# Patient Record
Sex: Female | Born: 1985
Health system: Southern US, Community
[De-identification: ages and names within clinical notes are randomized; demographics above are authoritative.]

## PROBLEM LIST (undated history)

## (undated) DIAGNOSIS — E282 Polycystic ovarian syndrome: Secondary | ICD-10-CM

## (undated) DIAGNOSIS — F419 Anxiety disorder, unspecified: Secondary | ICD-10-CM

## (undated) DIAGNOSIS — D249 Benign neoplasm of unspecified breast: Secondary | ICD-10-CM

## (undated) DIAGNOSIS — O24419 Gestational diabetes mellitus in pregnancy, unspecified control: Secondary | ICD-10-CM

## (undated) DIAGNOSIS — E785 Hyperlipidemia, unspecified: Secondary | ICD-10-CM

## (undated) HISTORY — PX: LEFT OOPHORECTOMY: SHX1961

## (undated) HISTORY — DX: Benign neoplasm of unspecified breast: D24.9

## (undated) HISTORY — PX: BREAST BIOPSY: SHX20

## (undated) HISTORY — PX: UNILATERAL SALPINGECTOMY: SHX6160

## (undated) HISTORY — DX: Polycystic ovarian syndrome: E28.2

## (undated) HISTORY — PX: OTHER SURGICAL HISTORY: SHX169

## (undated) HISTORY — PX: WISDOM TOOTH EXTRACTION: SHX21

## (undated) HISTORY — DX: Hyperlipidemia, unspecified: E78.5

## (undated) HISTORY — DX: Anxiety disorder, unspecified: F41.9

---
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
## 2014-06-16 ENCOUNTER — Ambulatory Visit (INDEPENDENT_AMBULATORY_CARE_PROVIDER_SITE_OTHER): Payer: No Typology Code available for payment source | Admitting: Internal Medicine

## 2014-06-16 VITALS — BP 118/78 | HR 108 | Temp 98.0°F | Resp 16 | Ht 67.0 in | Wt 153.4 lb

## 2014-06-16 DIAGNOSIS — M542 Cervicalgia: Secondary | ICD-10-CM

## 2014-06-16 DIAGNOSIS — S39012A Strain of muscle, fascia and tendon of lower back, initial encounter: Secondary | ICD-10-CM

## 2014-06-16 MED ORDER — CYCLOBENZAPRINE HCL 10 MG PO TABS: 10.0000 mg | ORAL_TABLET | Freq: Three times a day (TID) | ORAL | Status: DC | PRN

## 2014-06-16 MED ORDER — MELOXICAM 7.5 MG PO TABS: 7.5000 mg | ORAL_TABLET | Freq: Every day | ORAL | Status: DC

## 2014-06-16 NOTE — Patient Instructions (Signed)
You can return to work on Friday 06/20/14.  Please continue to ice for the rest of the day today then starting tomorrow rotate between heat and ice. Please take the mobic as prescribed, 7.5 mg once daily for pain and inflammation.  Please take the flexeril as prescribed (10 mg up to three times per day as needed). Be sure not to drive while you are on the flexeril.  Please return to clinic if symptoms have not improved in the next few days.
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
## 2014-06-18 ENCOUNTER — Telehealth: Payer: Self-pay

## 2014-06-18 NOTE — Telephone Encounter (Signed)
Check it out--call to trouble shoot and if no red flags try esgic q6h prn HA #20 with f/u 5-6 days If concerns, recheck now
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
## 2014-06-18 NOTE — Telephone Encounter (Signed)
Pt states that she uses the muscle relaxers are working to dull the ache. She does better with the Flexeril than the Mobic- need some type pain medication to help during the day. Pt has not tried any OTC pain medication to aid her healing. She did take Ibuprofen at first and stopped taking this when she got the prescription. Pt was advised to not take any NSAIDS with the mobic. She would like to find out if it would be possible for her to stay out of work until early next week.

## 2014-06-19 ENCOUNTER — Other Ambulatory Visit: Payer: Self-pay | Admitting: Radiology

## 2014-06-19 MED ORDER — BUTALBITAL-APAP-CAFFEINE 50-325-40 MG PO TABS: 1.0000 | ORAL_TABLET | Freq: Four times a day (QID) | ORAL | Status: DC | PRN

## 2014-06-19 NOTE — Telephone Encounter (Signed)
Pt is not having any of the sx's you listed, so I will send in esgic for her and extend her work note until 06/24/14.

## 2014-06-19 NOTE — Telephone Encounter (Signed)
Please call pt. I expected that she would be able to go back to work today. Please call and ask her if she is having any symptoms such as extremity weakness or numbness, extreme pain, loss of bowel or bladder control, or fever. If she has any of these symptoms please advise her to return to the office for evaluation. If not, please send in esgic. One po every six hours as needed for pain. #20. No refills. And ok to extend work note to return to work on Tuesday 06/24/14. If she needs any extension further than that she will need to come in to see [place].

## 2014-07-28 ENCOUNTER — Telehealth: Payer: Self-pay | Admitting: Internal Medicine

## 2014-07-28 NOTE — Telephone Encounter (Signed)
[employer] has faxed a request for billing statements from 06/15/2014-present. Looks like patient was only seen once on 06/16/2014. Please print and forward to disabilities when complete.   Thank you so very much!  -[facility]

## 2014-08-22 DIAGNOSIS — Z0271 Encounter for disability determination: Secondary | ICD-10-CM

## 2016-07-23 DIAGNOSIS — H5213 Myopia, bilateral: Secondary | ICD-10-CM | POA: Diagnosis not present

## 2017-01-31 DIAGNOSIS — E559 Vitamin D deficiency, unspecified: Secondary | ICD-10-CM | POA: Diagnosis not present

## 2017-01-31 DIAGNOSIS — Z1389 Encounter for screening for other disorder: Secondary | ICD-10-CM | POA: Diagnosis not present

## 2017-01-31 DIAGNOSIS — Z711 Person with feared health complaint in whom no diagnosis is made: Secondary | ICD-10-CM | POA: Diagnosis not present

## 2017-01-31 DIAGNOSIS — R87610 Atypical squamous cells of undetermined significance on cytologic smear of cervix (ASC-US): Secondary | ICD-10-CM | POA: Diagnosis not present

## 2017-01-31 DIAGNOSIS — Z01419 Encounter for gynecological examination (general) (routine) without abnormal findings: Secondary | ICD-10-CM | POA: Diagnosis not present

## 2017-01-31 DIAGNOSIS — Z79899 Other long term (current) drug therapy: Secondary | ICD-10-CM | POA: Diagnosis not present

## 2017-01-31 DIAGNOSIS — Z0000 Encounter for general adult medical examination without abnormal findings: Secondary | ICD-10-CM | POA: Diagnosis not present

## 2017-01-31 DIAGNOSIS — R5383 Other fatigue: Secondary | ICD-10-CM | POA: Diagnosis not present

## 2017-01-31 DIAGNOSIS — Z114 Encounter for screening for human immunodeficiency virus [HIV]: Secondary | ICD-10-CM | POA: Diagnosis not present

## 2017-01-31 DIAGNOSIS — R0602 Shortness of breath: Secondary | ICD-10-CM | POA: Diagnosis not present

## 2017-04-05 DIAGNOSIS — N926 Irregular menstruation, unspecified: Secondary | ICD-10-CM | POA: Diagnosis not present

## 2017-04-05 DIAGNOSIS — R87610 Atypical squamous cells of undetermined significance on cytologic smear of cervix (ASC-US): Secondary | ICD-10-CM | POA: Diagnosis not present

## 2017-04-05 MED FILL — MEDROXYPROGESTERONE 10 MG T: 10 | 90 days supply | Qty: 30 | Fill #0

## 2017-07-06 ENCOUNTER — Encounter (HOSPITAL_COMMUNITY): Payer: Self-pay | Admitting: Emergency Medicine

## 2017-07-06 ENCOUNTER — Emergency Department (HOSPITAL_COMMUNITY)
Admission: EM | Admit: 2017-07-06 | Discharge: 2017-07-06 | Disposition: A | Discharge status: Home / Self Care | Payer: 59 | Attending: Emergency Medicine | Admitting: Emergency Medicine

## 2017-07-06 ENCOUNTER — Encounter: Payer: Self-pay | Admitting: Physician Assistant

## 2017-07-06 ENCOUNTER — Ambulatory Visit (INDEPENDENT_AMBULATORY_CARE_PROVIDER_SITE_OTHER): Payer: 59 | Admitting: Physician Assistant

## 2017-07-06 ENCOUNTER — Emergency Department (HOSPITAL_COMMUNITY): Payer: 59

## 2017-07-06 VITALS — BP 118/83 | HR 99 | Temp 97.7°F | Resp 20 | Ht 63.5 in | Wt 164.8 lb

## 2017-07-06 DIAGNOSIS — R1084 Generalized abdominal pain: Secondary | ICD-10-CM

## 2017-07-06 DIAGNOSIS — R109 Unspecified abdominal pain: Secondary | ICD-10-CM | POA: Diagnosis present

## 2017-07-06 DIAGNOSIS — N9489 Other specified conditions associated with female genital organs and menstrual cycle: Secondary | ICD-10-CM

## 2017-07-06 DIAGNOSIS — R112 Nausea with vomiting, unspecified: Secondary | ICD-10-CM | POA: Diagnosis not present

## 2017-07-06 DIAGNOSIS — R1900 Intra-abdominal and pelvic swelling, mass and lump, unspecified site: Secondary | ICD-10-CM

## 2017-07-06 DIAGNOSIS — R1030 Lower abdominal pain, unspecified: Secondary | ICD-10-CM | POA: Diagnosis not present

## 2017-07-06 DIAGNOSIS — Z79899 Other long term (current) drug therapy: Secondary | ICD-10-CM | POA: Diagnosis not present

## 2017-07-06 DIAGNOSIS — R1907 Generalized intra-abdominal and pelvic swelling, mass and lump: Secondary | ICD-10-CM | POA: Diagnosis not present

## 2017-07-06 DIAGNOSIS — R1909 Other intra-abdominal and pelvic swelling, mass and lump: Secondary | ICD-10-CM | POA: Insufficient documentation

## 2017-07-06 DIAGNOSIS — D259 Leiomyoma of uterus, unspecified: Secondary | ICD-10-CM | POA: Diagnosis not present

## 2017-07-06 LAB — CBC
HEMATOCRIT: 40.6 % (ref 36.0–46.0)
HEMOGLOBIN: 13.3 g/dL (ref 12.0–15.0)
MCH: 28.7 pg (ref 26.0–34.0)
MCHC: 32.8 g/dL (ref 30.0–36.0)
MCV: 87.5 fL (ref 78.0–100.0)
Platelets: 277 10*3/uL (ref 150–400)
RBC: 4.64 MIL/uL (ref 3.87–5.11)
RDW: 12.8 % (ref 11.5–15.5)
WBC: 5.3 10*3/uL (ref 4.0–10.5)

## 2017-07-06 LAB — URINALYSIS, ROUTINE W REFLEX MICROSCOPIC
Bilirubin Urine: NEGATIVE
Glucose, UA: NEGATIVE mg/dL
HGB URINE DIPSTICK: NEGATIVE
Ketones, ur: NEGATIVE mg/dL
Leukocytes, UA: NEGATIVE
NITRITE: NEGATIVE
PROTEIN: 30 mg/dL — AB
SPECIFIC GRAVITY, URINE: 1.025 (ref 1.005–1.030)
pH: 6 (ref 5.0–8.0)

## 2017-07-06 LAB — COMPREHENSIVE METABOLIC PANEL
ALBUMIN: 4.3 g/dL (ref 3.5–5.0)
ALK PHOS: 103 U/L (ref 38–126)
ALT: 23 U/L (ref 14–54)
AST: 24 U/L (ref 15–41)
Anion gap: 9 (ref 5–15)
BILIRUBIN TOTAL: 0.6 mg/dL (ref 0.3–1.2)
BUN: 17 mg/dL (ref 6–20)
CALCIUM: 9.5 mg/dL (ref 8.9–10.3)
CO2: 23 mmol/L (ref 22–32)
Chloride: 104 mmol/L (ref 101–111)
Creatinine, Ser: 0.99 mg/dL (ref 0.44–1.00)
GFR calc Af Amer: >60 mL/min (ref >60)
GFR calc non Af Amer: >60 mL/min (ref >60)
GLUCOSE: 111 mg/dL — AB (ref 65–99)
Potassium: 4.3 mmol/L (ref 3.5–5.1)
SODIUM: 136 mmol/L (ref 135–145)
TOTAL PROTEIN: 8.4 g/dL — AB (ref 6.5–8.1)

## 2017-07-06 LAB — I-STAT BETA HCG BLOOD, ED (MC, WL, AP ONLY)

## 2017-07-06 LAB — LIPASE, BLOOD: Lipase: 26 U/L (ref 11–51)

## 2017-07-06 LAB — POC URINE PREG, ED: PREG TEST UR: NEGATIVE

## 2017-07-06 MED ORDER — KETOROLAC TROMETHAMINE 30 MG/ML IJ SOLN
30.0000 mg | Freq: Once | INTRAMUSCULAR | Status: AC
Start: 2017-07-06 — End: 2017-07-06
  Administered 2017-07-06: 30 mg via INTRAMUSCULAR
  Filled 2017-07-06: qty 1

## 2017-07-06 MED ORDER — OXYCODONE-ACETAMINOPHEN 5-325 MG PO TABS
1.0000 | ORAL_TABLET | Freq: Once | ORAL | Status: AC
  Administered 2017-07-06: 1 via ORAL
  Filled 2017-07-06: qty 1

## 2017-07-06 MED ORDER — OXYCODONE-ACETAMINOPHEN 5-325 MG PO TABS: 1.0000 | ORAL_TABLET | ORAL | 0 refills | Status: DC | PRN

## 2017-07-06 NOTE — ED Provider Notes (Signed)
31 year old female presents today with pelvic pain and is noted on ultrasound to have a pelvic mass.  She is not pregnant.  She is hemodynamically stable.  We are awaiting call from her OB/GYN to arrange follow-up.  I discussed this with the patient and she voices understanding.   Pattricia Boss, MD 07/09/17 0001

## 2017-07-06 NOTE — ED Triage Notes (Signed)
Pt reports new lower abd pain today 2 episodes of emesis, denies fevers/chills.  Pt reports being referred from [facility] today, told she needed a transvag ultrasound. LMP mid Sept.

## 2017-07-06 NOTE — Progress Notes (Signed)
Melissa Liu  MRN: [id number] DOB: [birth year]  Subjective:   Melissa Liu is a 31 y.o. female who presents for evaluation of abdominal pain. Onset was 1 day ago. Symptoms have been unchanged. The pain is described as sharp, and is 10/10 in intensity. Pain is located in the periumbilical region and LLQ with radiation to left hip.  Aggravating factors: any position.  Alleviating factors: none. Associated symptoms: diarrhea, nausea, and vomiting (5 episodes since yesterday). The patient denies hematemesis, anorexia, belching, constipation, dysuria, fever, frequency, hematochezia, hematuria, melena, myalgias and sweats. Menstrual cycles are irregular. LMP 05/21/17. She is not on birth control. Took home pregnancy test last nigh and it was normal. She is sexually active. Last time she ate was yesterday, ate some chicken from [employer]. Her husband ate the same thing and is not having any symptoms. She is able to drink water. PSH includes wisdom tooth extraction. No hx of ovarian cysts or uterine fibroids. Drinks alcohol occasionally. Denies smoking.    Review of Systems  Genitourinary: Negative for vaginal discharge and vaginal pain.  Neurological: Negative for dizziness, light-headedness and headaches.    There are no active problems to display for this patient.   No current outpatient prescriptions on file prior to visit.   No current facility-administered medications on file prior to visit.     No Known Allergies   Objective:  BP 118/83 (BP Location: Left Arm, Patient Position: Sitting, Cuff Size: Normal)   Pulse 99   Temp 97.7 F (36.5 C) (Oral)   Resp 20   Ht 5' 3.5" (1.613 m)   Wt 164 lb 12.8 oz (74.8 kg)   LMP 05/23/2017   SpO2 97%   BMI 28.73 kg/m   Physical Exam  Constitutional: She is oriented to person, place, and time.  Appears uncomfortable sitting on exam table holding stomach.   HENT:  Head: Normocephalic and atraumatic.  Eyes: Conjunctivae are normal.    Neck: Normal range of motion.  Pulmonary/Chest: Effort normal.  Abdominal: Soft. Normal appearance and bowel sounds are normal. There is no hepatomegaly. There is generalized tenderness (exquisite tenderess with palpation of all regions of abdomen, more prominent in LLQ and suprapubic region ). There is guarding (voluntary). There is no CVA tenderness.  Genitourinary: Right adnexa normal and vulva normal. Uterus is enlarged (palpated up to umbilical region). Cervix exhibits no motion tenderness. Left adnexum displays tenderness. Left adnexum displays no mass. Thick  creamy and vaginal discharge found.  Neurological: She is alert and oriented to person, place, and time.  Holding on to her husband as she walks down the hall in bent over position.   Skin: Skin is warm and dry.  Psychiatric: Affect normal.  Vitals reviewed.  No results found for this or any previous visit (from the past 24 hour(s)).   Assessment and Plan :  This case was precepted with Dr. Pamella Pert.   1. Generalized abdominal pain 2. Nausea and vomiting, intractability of vomiting not specified, unspecified vomiting type Pain is out of proportion to exam.  She cannot ambulate without assistance from her husband due to pain.  She is actively [place] office. She is exquisitely tender to palpation diffusely on abdomen.  Concern for acute abdomen. Discussed patient with Dr. Pamella Pert and she agrees that pt warrants further evaluation by ED. Her vitals are stable and she was brought here by her husband. She does not warrant EMS transport. Agrees to go to ED via personal vehicle.   Tenna Delaine PA-C  [facility] [place] 07/06/2017 11:09 AM

## 2017-07-06 NOTE — ED Notes (Signed)
ED Provider at bedside. 

## 2017-07-06 NOTE — Patient Instructions (Signed)
  Please Go To [place]. [facility] [address], [place], [place] [address]     IF you received an x-ray today, you will receive an invoice from [facility]. Please contact [facility] at [phone] with questions or concerns regarding your invoice.   IF you received labwork today, you will receive an invoice from [facility]. Please contact [facility] at [phone] with questions or concerns regarding your invoice.   Our billing staff will not be able to assist you with questions regarding bills from these companies.  You will be contacted with the lab results as soon as they are available. The fastest way to get your results is to activate your My Chart account. Instructions are located on the last page of this paperwork. If you have not heard from [place] regarding the results in 2 weeks, please contact this office.

## 2017-07-06 NOTE — Discharge Instructions (Signed)
Call Dr. Harrington Challenger in the morning to be seen within the next week. Use birth control.
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
## 2017-07-06 NOTE — ED Notes (Addendum)
LMP approx Sept 16-23. Pt reports she has not taken [facility] since May. Pt reports she got married and has been trying to conceive.

## 2017-07-06 NOTE — Progress Notes (Deleted)
Subjective:     Melissa Liu is a 31 y.o. female who presents for evaluation of abdominal pain. Onset was {numbers; 1-10:13787} {unit:19031} ago. Symptoms have been {course:17::"unchanged"}. The pain is described as {quality:19151}, and is {0-10:5044}/10 in intensity. Pain is located in the {anatomy; location abdomen:19149} {abdomen pain radiation:616}.  Aggravating factors: {aggrav factors:619}.  Alleviating factors: {allev factors:620}. Associated symptoms: {assoc symptoms:621}. The patient denies {assoc symptoms:19032}.  {Misc; AMB Common SmartLinks:21383::"The patient's history has been marked as reviewed and updated as appropriate."}  Review of Systems {ros - complete:30496}     Objective:    {Exam, Complete:17964}    Assessment:    Abdominal pain, likely secondary to *** .    Plan:    {abd pain treatment plan:14425}

## 2017-07-06 NOTE — ED Notes (Signed)
Patient transported to Ultrasound 

## 2017-07-06 NOTE — ED Provider Notes (Addendum)
Thank operating room [facility] Provider Note   CSN: [id number] Arrival date & time: 07/06/17  1135     History   Chief Complaint Chief Complaint  Patient presents with  . Abdominal Pain    HPI Melissa Liu is a 31 y.o. female.  HPI Patient presents with abdominal pain.  Began yesterday but had another episode today.  Crampy lower abdominal pain.  Seen in urgent care and sent here.  Has lower abdominal mass.  Last menses was in September but has had some change in her hormonal treatments.  Had a negative pregnancy test at home and at [facility].  No dysuria.  Has had nausea and vomiting and some loose stool but not frank diarrhea.  No fevers.  Pelvic exam done and reportedly no cervical motion tenderness and some mild white discharge.  Cultures or samples not appear to have been done.  Sent here for ultrasound and further evaluation. Past Medical History:  Diagnosis Date  . Hyperlipidemia     There are no active problems to display for this patient.   History reviewed. No pertinent surgical history.  OB History    No data available       Home Medications    Prior to Admission medications   Medication Sig Start Date End Date Taking? Authorizing Provider  acetaminophen (TYLENOL) 650 MG CR tablet Take 1,300 mg by mouth every 8 (eight) hours as needed for pain.   Yes [provider]  medroxyPROGESTERone (PROVERA) 10 MG tablet Take 10 mg by mouth daily. 04/05/17  Yes [provider]  Prenatal Vit-Fe Fumarate-FA (MULTIVITAMIN-PRENATAL) 27-0.8 MG TABS tablet Take 2 tablets by mouth daily at 12 noon.   Yes [provider]  PRESCRIPTION MEDICATION Take 1 tablet by mouth once. Muscle relaxant that her husband gave her   Yes [provider]    Family History Family History  Problem Relation Age of Onset  . Hyperlipidemia Mother   . Hyperlipidemia Father   . Hyperlipidemia Maternal Grandmother   .  Hypertension Maternal Grandmother   . Cancer Maternal Grandfather   . Diabetes Maternal Grandfather     Social History Social History  Substance Use Topics  . Smoking status: Never Smoker  . Smokeless tobacco: Never Used  . Alcohol use Yes     Comment: occ     Allergies   Coconut oil   Review of Systems Review of Systems  Constitutional: Negative for appetite change, chills and fever.  Cardiovascular: Negative for chest pain.  Gastrointestinal: Positive for abdominal pain, nausea and vomiting.  Genitourinary: Negative for frequency, vaginal bleeding and vaginal discharge.  Musculoskeletal: Negative for back pain.  Skin: Negative for rash.  Neurological: Negative for syncope.  Hematological: Negative for adenopathy.  Psychiatric/Behavioral: Negative for confusion.     Physical Exam Updated Vital Signs BP 125/89 (BP Location: Right Arm)   Pulse 99   Temp 98.2 F (36.8 C) (Oral)   Resp 18   LMP 05/22/2017 (Exact Date)   SpO2 100%   Physical Exam  Constitutional: She appears well-developed.  HENT:  Head: Atraumatic.  Eyes: Pupils are equal, round, and reactive to light.  Neck: Neck supple.  Cardiovascular: Normal rate.   Pulmonary/Chest: No respiratory distress.  Abdominal: She exhibits mass.  Suprapubic to right lower quadrant mass that goes up to around the umbilicus.  Mild tenderness.  No rebound or guarding.  No hernias palpated.  Musculoskeletal: She exhibits no edema.  Neurological: She is alert.  Skin: Skin is warm. Capillary refill takes less than 2 seconds.  Psychiatric: She has a normal mood and affect.     ED Treatments / Results  Labs (all labs ordered are listed, but only abnormal results are displayed) Labs Reviewed  COMPREHENSIVE METABOLIC PANEL - Abnormal; Notable for the following:       Result Value   Glucose, Bld 111 (*)    Total Protein 8.4 (*)    All other components within normal limits  URINALYSIS, ROUTINE W REFLEX MICROSCOPIC -  Abnormal; Notable for the following:    APPearance HAZY (*)    Protein, ur 30 (*)    Bacteria, UA RARE (*)    Squamous Epithelial / LPF 0-5 (*)    All other components within normal limits  LIPASE, BLOOD  CBC  POC URINE PREG, ED  I-STAT BETA HCG BLOOD, ED ([facility], [facility], [facility] ONLY)    EKG  EKG Interpretation None       Radiology No results found.  Procedures Procedures (including critical care time)  Medications Ordered in ED Medications - No data to display   Initial Impression / Assessment and Plan / ED Course  I have reviewed the triage vital signs and the nursing notes.  Pertinent labs & imaging results that were available during my care of the patient were reviewed by me and considered in my medical decision making (see chart for details).     Patient with abdominal pain.  Has pelvic mass.  Likely ovarian cyst but ultrasound pending.  Doubt infectious.  Wet prep or other cultures not done at urgent care but I think it is okay to defer at this time.  Has GYN for follow-up.  Care will be turned over to Dr. Jeanell Sparrow Final Clinical Impressions(s) / ED Diagnoses   Final diagnoses:  Pelvic pain    New Prescriptions New Prescriptions   No medications on file     Melissa Belling, MD 07/06/17 [place]    Melissa Belling, MD 07/06/17 1524

## 2017-07-13 DIAGNOSIS — D259 Leiomyoma of uterus, unspecified: Secondary | ICD-10-CM | POA: Diagnosis not present

## 2017-07-13 DIAGNOSIS — R1909 Other intra-abdominal and pelvic swelling, mass and lump: Secondary | ICD-10-CM | POA: Diagnosis not present

## 2017-07-22 DIAGNOSIS — H5213 Myopia, bilateral: Secondary | ICD-10-CM | POA: Diagnosis not present

## 2018-02-06 DIAGNOSIS — R87610 Atypical squamous cells of undetermined significance on cytologic smear of cervix (ASC-US): Secondary | ICD-10-CM | POA: Diagnosis not present

## 2018-02-06 DIAGNOSIS — Z124 Encounter for screening for malignant neoplasm of cervix: Secondary | ICD-10-CM | POA: Diagnosis not present

## 2018-02-06 DIAGNOSIS — Z01419 Encounter for gynecological examination (general) (routine) without abnormal findings: Secondary | ICD-10-CM | POA: Diagnosis not present

## 2018-02-12 DIAGNOSIS — R319 Hematuria, unspecified: Secondary | ICD-10-CM | POA: Diagnosis not present

## 2018-02-12 DIAGNOSIS — Z13220 Encounter for screening for lipoid disorders: Secondary | ICD-10-CM | POA: Diagnosis not present

## 2018-02-12 DIAGNOSIS — Z0000 Encounter for general adult medical examination without abnormal findings: Secondary | ICD-10-CM | POA: Diagnosis not present

## 2018-02-12 DIAGNOSIS — E559 Vitamin D deficiency, unspecified: Secondary | ICD-10-CM | POA: Diagnosis not present

## 2018-02-12 DIAGNOSIS — Z79899 Other long term (current) drug therapy: Secondary | ICD-10-CM | POA: Diagnosis not present

## 2018-02-12 DIAGNOSIS — R0602 Shortness of breath: Secondary | ICD-10-CM | POA: Diagnosis not present

## 2018-02-12 DIAGNOSIS — Z114 Encounter for screening for human immunodeficiency virus [HIV]: Secondary | ICD-10-CM | POA: Diagnosis not present

## 2018-02-12 DIAGNOSIS — E538 Deficiency of other specified B group vitamins: Secondary | ICD-10-CM | POA: Diagnosis not present

## 2018-02-12 DIAGNOSIS — R5383 Other fatigue: Secondary | ICD-10-CM | POA: Diagnosis not present

## 2018-02-12 DIAGNOSIS — Z131 Encounter for screening for diabetes mellitus: Secondary | ICD-10-CM | POA: Diagnosis not present

## 2018-02-12 MED FILL — SULFAMETHOXAZOLE-TMP DS TAB: 800-160 | 7 days supply | Qty: 14 | Fill #0

## 2018-02-12 MED FILL — HYDROXYZINE PAM 25 MG CAP: 25 | 5 days supply | Qty: 30 | Fill #0

## 2018-02-19 DIAGNOSIS — R319 Hematuria, unspecified: Secondary | ICD-10-CM | POA: Diagnosis not present

## 2018-03-14 DIAGNOSIS — R319 Hematuria, unspecified: Secondary | ICD-10-CM | POA: Diagnosis not present

## 2018-03-14 DIAGNOSIS — Z6832 Body mass index (BMI) 32.0-32.9, adult: Secondary | ICD-10-CM | POA: Diagnosis not present

## 2018-03-14 DIAGNOSIS — R635 Abnormal weight gain: Secondary | ICD-10-CM | POA: Diagnosis not present

## 2018-03-14 MED FILL — IBUPROFEN 800 MG TAB: 800 | 30 days supply | Qty: 90 | Fill #0

## 2018-03-14 MED FILL — DIETHYLPROPION ER 75 MG TAB: 75 | 30 days supply | Qty: 30 | Fill #0

## 2018-04-09 DIAGNOSIS — Z3201 Encounter for pregnancy test, result positive: Secondary | ICD-10-CM | POA: Diagnosis not present

## 2018-04-24 DIAGNOSIS — O200 Threatened abortion: Secondary | ICD-10-CM | POA: Diagnosis not present

## 2018-04-24 DIAGNOSIS — O26849 Uterine size-date discrepancy, unspecified trimester: Secondary | ICD-10-CM | POA: Diagnosis not present

## 2018-04-24 MED FILL — PROGESTERONE MICRONIZED 200: 200 | 30 days supply | Qty: 30 | Fill #0

## 2018-05-01 DIAGNOSIS — O3680X9 Pregnancy with inconclusive fetal viability, other fetus: Secondary | ICD-10-CM | POA: Diagnosis not present

## 2018-05-03 MED FILL — miSOPROStol 200 MCG TABS: 200 | 1 days supply | Qty: 4 | Fill #0

## 2018-05-10 ENCOUNTER — Other Ambulatory Visit: Payer: Self-pay | Admitting: Obstetrics and Gynecology

## 2018-05-10 ENCOUNTER — Ambulatory Visit (HOSPITAL_COMMUNITY): Payer: 59 | Admitting: Certified Registered Nurse Anesthetist

## 2018-05-10 ENCOUNTER — Encounter (HOSPITAL_COMMUNITY): Payer: Self-pay | Admitting: Certified Registered"

## 2018-05-10 ENCOUNTER — Other Ambulatory Visit: Payer: Self-pay

## 2018-05-10 ENCOUNTER — Encounter (HOSPITAL_COMMUNITY)
Admission: AD | Disposition: A | Discharge status: Home / Self Care | Payer: Self-pay | Source: Home / Self Care | Attending: Obstetrics and Gynecology

## 2018-05-10 ENCOUNTER — Inpatient Hospital Stay (HOSPITAL_COMMUNITY)
Admission: AD | Admit: 2018-05-10 | Discharge: 2018-05-12 | DRG: 819 | Disposition: A | Discharge status: Home / Self Care | Payer: 59 | Attending: Obstetrics and Gynecology | Admitting: Obstetrics and Gynecology

## 2018-05-10 DIAGNOSIS — N9489 Other specified conditions associated with female genital organs and menstrual cycle: Secondary | ICD-10-CM | POA: Diagnosis present

## 2018-05-10 DIAGNOSIS — D271 Benign neoplasm of left ovary: Secondary | ICD-10-CM | POA: Diagnosis not present

## 2018-05-10 DIAGNOSIS — Z793 Long term (current) use of hormonal contraceptives: Secondary | ICD-10-CM

## 2018-05-10 DIAGNOSIS — O008 Other ectopic pregnancy without intrauterine pregnancy: Secondary | ICD-10-CM | POA: Diagnosis not present

## 2018-05-10 DIAGNOSIS — O3680X9 Pregnancy with inconclusive fetal viability, other fetus: Secondary | ICD-10-CM | POA: Diagnosis not present

## 2018-05-10 DIAGNOSIS — N839 Noninflammatory disorder of ovary, fallopian tube and broad ligament, unspecified: Secondary | ICD-10-CM | POA: Diagnosis present

## 2018-05-10 DIAGNOSIS — Z9889 Other specified postprocedural states: Secondary | ICD-10-CM

## 2018-05-10 DIAGNOSIS — Z91018 Allergy to other foods: Secondary | ICD-10-CM

## 2018-05-10 HISTORY — PX: XI ROBOT ASSISTED DIAGNOSTIC LAPAROSCOPY: SHX6815

## 2018-05-10 LAB — CBC
HCT: 35.1 % — ABNORMAL LOW (ref 36.0–46.0)
Hemoglobin: 11.5 g/dL — ABNORMAL LOW (ref 12.0–15.0)
MCH: 28.3 pg (ref 26.0–34.0)
MCHC: 32.8 g/dL (ref 30.0–36.0)
MCV: 86.2 fL (ref 78.0–100.0)
PLATELETS: 291 10*3/uL (ref 150–400)
RBC: 4.07 MIL/uL (ref 3.87–5.11)
RDW: 13.2 % (ref 11.5–15.5)
WBC: 7.8 10*3/uL (ref 4.0–10.5)

## 2018-05-10 LAB — TYPE AND SCREEN
ABO/RH(D): O POS
ANTIBODY SCREEN: NEGATIVE

## 2018-05-10 SURGERY — LAPAROSCOPY, DIAGNOSTIC, ROBOT-ASSISTED
Anesthesia: General | Site: Abdomen | Laterality: Left

## 2018-05-10 MED ORDER — SODIUM CHLORIDE 0.9 % IV SOLN
INTRAVENOUS | Status: DC | PRN
  Administered 2018-05-10: 30 mL

## 2018-05-10 MED ORDER — GABAPENTIN 300 MG PO CAPS
300.0000 mg | ORAL_CAPSULE | ORAL | Status: AC
  Administered 2018-05-10: 300 mg via ORAL
  Filled 2018-05-10: qty 1

## 2018-05-10 MED ORDER — ONDANSETRON HCL 4 MG/2ML IJ SOLN: 4.0000 mg | Freq: Four times a day (QID) | INTRAMUSCULAR | Status: DC | PRN

## 2018-05-10 MED ORDER — LIDOCAINE 2% (20 MG/ML) 5 ML SYRINGE
INTRAMUSCULAR | Status: DC | PRN
  Administered 2018-05-10: 100 mg via INTRAVENOUS

## 2018-05-10 MED ORDER — 0.9 % SODIUM CHLORIDE (POUR BTL) OPTIME
TOPICAL | Status: DC | PRN
  Administered 2018-05-10: 2000 mL

## 2018-05-10 MED ORDER — PHENYLEPHRINE 40 MCG/ML (10ML) SYRINGE FOR IV PUSH (FOR BLOOD PRESSURE SUPPORT)
PREFILLED_SYRINGE | INTRAVENOUS | Status: DC | PRN
  Administered 2018-05-10 (×7): 80 ug via INTRAVENOUS

## 2018-05-10 MED ORDER — OXYCODONE HCL 5 MG PO TABS: 5.0000 mg | ORAL_TABLET | Freq: Once | ORAL | Status: DC | PRN

## 2018-05-10 MED ORDER — MENTHOL 3 MG MT LOZG: 1.0000 | LOZENGE | OROMUCOSAL | Status: DC | PRN

## 2018-05-10 MED ORDER — DEXAMETHASONE SODIUM PHOSPHATE 10 MG/ML IJ SOLN
INTRAMUSCULAR | Status: DC | PRN
  Administered 2018-05-10: 10 mg via INTRAVENOUS

## 2018-05-10 MED ORDER — TRANEXAMIC ACID 1000 MG/10ML IV SOLN
1000.0000 mg | INTRAVENOUS | Status: AC
  Administered 2018-05-10: 1000 mg via INTRAVENOUS
  Filled 2018-05-10: qty 10
  Filled 2018-05-10: qty 1000

## 2018-05-10 MED ORDER — OXYCODONE-ACETAMINOPHEN 5-325 MG PO TABS
1.0000 | ORAL_TABLET | ORAL | Status: DC | PRN
  Administered 2018-05-10 – 2018-05-12 (×5): 1 via ORAL
  Filled 2018-05-10 (×6): qty 1

## 2018-05-10 MED ORDER — DIPHENHYDRAMINE HCL 12.5 MG/5ML PO ELIX: 12.5000 mg | ORAL_SOLUTION | Freq: Four times a day (QID) | ORAL | Status: DC | PRN

## 2018-05-10 MED ORDER — OXYCODONE HCL 5 MG/5ML PO SOLN
5.0000 mg | Freq: Once | ORAL | Status: DC | PRN
  Filled 2018-05-10: qty 5

## 2018-05-10 MED ORDER — SODIUM CHLORIDE 0.9 % IR SOLN
Status: DC | PRN
  Administered 2018-05-10: 1000 mL

## 2018-05-10 MED ORDER — KETOROLAC TROMETHAMINE 30 MG/ML IJ SOLN: 30.0000 mg | Freq: Once | INTRAMUSCULAR | Status: DC | PRN

## 2018-05-10 MED ORDER — ACETAMINOPHEN 500 MG PO TABS
1000.0000 mg | ORAL_TABLET | ORAL | Status: AC
  Administered 2018-05-10: 1000 mg via ORAL
  Filled 2018-05-10: qty 2

## 2018-05-10 MED ORDER — ALBUMIN HUMAN 5 % IV SOLN
INTRAVENOUS | Status: AC
  Filled 2018-05-10: qty 250

## 2018-05-10 MED ORDER — IBUPROFEN 800 MG PO TABS
800.0000 mg | ORAL_TABLET | Freq: Three times a day (TID) | ORAL | Status: DC | PRN
  Administered 2018-05-11: 800 mg via ORAL
  Filled 2018-05-10: qty 1

## 2018-05-10 MED ORDER — ALBUMIN HUMAN 5 % IV SOLN
INTRAVENOUS | Status: DC | PRN
  Administered 2018-05-10: 17:00:00 via INTRAVENOUS

## 2018-05-10 MED ORDER — MORPHINE SULFATE 2 MG/ML IV SOLN
INTRAVENOUS | Status: DC
  Administered 2018-05-10: 2 mg via INTRAVENOUS
  Administered 2018-05-10: 20:00:00 via INTRAVENOUS
  Administered 2018-05-11: 2 mg via INTRAVENOUS
  Administered 2018-05-11: 0 mg via INTRAVENOUS
  Administered 2018-05-11: 1 mg via INTRAVENOUS
  Filled 2018-05-10: qty 30

## 2018-05-10 MED ORDER — PROMETHAZINE HCL 25 MG/ML IJ SOLN: 6.2500 mg | INTRAMUSCULAR | Status: DC | PRN

## 2018-05-10 MED ORDER — CEFAZOLIN SODIUM-DEXTROSE 2-4 GM/100ML-% IV SOLN
2.0000 g | INTRAVENOUS | Status: AC
  Administered 2018-05-10: 2 g via INTRAVENOUS
  Filled 2018-05-10: qty 100

## 2018-05-10 MED ORDER — FENTANYL CITRATE (PF) 100 MCG/2ML IJ SOLN
INTRAMUSCULAR | Status: DC | PRN
  Administered 2018-05-10 (×4): 50 ug via INTRAVENOUS
  Administered 2018-05-10: 100 ug via INTRAVENOUS
  Administered 2018-05-10 (×3): 50 ug via INTRAVENOUS

## 2018-05-10 MED ORDER — ONDANSETRON HCL 4 MG PO TABS: 4.0000 mg | ORAL_TABLET | Freq: Four times a day (QID) | ORAL | Status: DC | PRN

## 2018-05-10 MED ORDER — LACTATED RINGERS IV SOLN
INTRAVENOUS | Status: DC
  Administered 2018-05-10 (×4): via INTRAVENOUS

## 2018-05-10 MED ORDER — CELECOXIB 200 MG PO CAPS
400.0000 mg | ORAL_CAPSULE | ORAL | Status: AC
  Administered 2018-05-10: 400 mg via ORAL
  Filled 2018-05-10: qty 2

## 2018-05-10 MED ORDER — SUGAMMADEX SODIUM 200 MG/2ML IV SOLN
INTRAVENOUS | Status: DC | PRN
  Administered 2018-05-10: 200 mg via INTRAVENOUS

## 2018-05-10 MED ORDER — ROCURONIUM BROMIDE 10 MG/ML (PF) SYRINGE
PREFILLED_SYRINGE | INTRAVENOUS | Status: DC | PRN
  Administered 2018-05-10: 10 mg via INTRAVENOUS
  Administered 2018-05-10: 40 mg via INTRAVENOUS
  Administered 2018-05-10: 10 mg via INTRAVENOUS

## 2018-05-10 MED ORDER — SUCCINYLCHOLINE CHLORIDE 200 MG/10ML IV SOSY
PREFILLED_SYRINGE | INTRAVENOUS | Status: DC | PRN
  Administered 2018-05-10: 120 mg via INTRAVENOUS

## 2018-05-10 MED ORDER — SCOPOLAMINE 1 MG/3DAYS TD PT72
1.0000 | MEDICATED_PATCH | TRANSDERMAL | Status: DC
  Administered 2018-05-10: 1.5 mg via TRANSDERMAL

## 2018-05-10 MED ORDER — MIDAZOLAM HCL 5 MG/5ML IJ SOLN
INTRAMUSCULAR | Status: DC | PRN
  Administered 2018-05-10: 2 mg via INTRAVENOUS

## 2018-05-10 MED ORDER — DEXAMETHASONE SODIUM PHOSPHATE 4 MG/ML IJ SOLN: 4.0000 mg | INTRAMUSCULAR | Status: DC

## 2018-05-10 MED ORDER — SODIUM CHLORIDE 0.9 % IJ SOLN
INTRAMUSCULAR | Status: AC
  Filled 2018-05-10: qty 50

## 2018-05-10 MED ORDER — ONDANSETRON HCL 4 MG/2ML IJ SOLN
INTRAMUSCULAR | Status: DC | PRN
  Administered 2018-05-10: 4 mg via INTRAVENOUS

## 2018-05-10 MED ORDER — PROPOFOL 10 MG/ML IV BOLUS
INTRAVENOUS | Status: DC | PRN
  Administered 2018-05-10: 180 mg via INTRAVENOUS

## 2018-05-10 MED ORDER — NALOXONE HCL 0.4 MG/ML IJ SOLN: 0.4000 mg | INTRAMUSCULAR | Status: DC | PRN

## 2018-05-10 MED ORDER — VASOPRESSIN 20 UNIT/ML IV SOLN
INTRAVENOUS | Status: DC | PRN
  Administered 2018-05-10: 2 mL via INTRAMUSCULAR

## 2018-05-10 MED ORDER — SOD CITRATE-CITRIC ACID 500-334 MG/5ML PO SOLN
30.0000 mL | ORAL | Status: AC
  Administered 2018-05-10: 30 mL via ORAL
  Filled 2018-05-10: qty 30

## 2018-05-10 MED ORDER — SODIUM CHLORIDE 0.9% FLUSH: 9.0000 mL | INTRAVENOUS | Status: DC | PRN

## 2018-05-10 MED ORDER — LIDOCAINE 20MG/ML (2%) 15 ML SYRINGE OPTIME
INTRAMUSCULAR | Status: DC | PRN
  Administered 2018-05-10: 1.5 mg/kg/h via INTRAVENOUS

## 2018-05-10 MED ORDER — DIPHENHYDRAMINE HCL 50 MG/ML IJ SOLN: 12.5000 mg | Freq: Four times a day (QID) | INTRAMUSCULAR | Status: DC | PRN

## 2018-05-10 MED ORDER — KETOROLAC TROMETHAMINE 30 MG/ML IJ SOLN: 30.0000 mg | Freq: Four times a day (QID) | INTRAMUSCULAR | Status: DC

## 2018-05-10 MED ORDER — FENTANYL CITRATE (PF) 100 MCG/2ML IJ SOLN: 25.0000 ug | INTRAMUSCULAR | Status: DC | PRN

## 2018-05-10 MED ORDER — DEXTROSE IN LACTATED RINGERS 5 % IV SOLN
INTRAVENOUS | Status: DC
  Administered 2018-05-10 – 2018-05-12 (×5): via INTRAVENOUS

## 2018-05-10 SURGICAL SUPPLY — 65 items
ADH SKN CLS APL DERMABOND .7 (GAUZE/BANDAGES/DRESSINGS) ×2
BARRIER ADHS 3X4 INTERCEED (GAUZE/BANDAGES/DRESSINGS) ×2 IMPLANT
BRR ADH 4X3 ABS CNTRL BYND (GAUZE/BANDAGES/DRESSINGS) ×2
CANISTER SUCT 3000ML PPV (MISCELLANEOUS) ×3 IMPLANT
CATH FOLEY 2WAY SLVR  5CC 16FR (CATHETERS) ×1
CATH FOLEY 2WAY SLVR 5CC 16FR (CATHETERS) ×2 IMPLANT
COVER BACK TABLE 60X90IN (DRAPES) ×3 IMPLANT
COVER TIP SHEARS 8 DVNC (MISCELLANEOUS) ×2 IMPLANT
COVER TIP SHEARS 8MM DA VINCI (MISCELLANEOUS) ×1
DECANTER SPIKE VIAL GLASS SM (MISCELLANEOUS) ×6 IMPLANT
DEFOGGER SCOPE WARMER CLEARIFY (MISCELLANEOUS) ×3 IMPLANT
DERMABOND ADVANCED (GAUZE/BANDAGES/DRESSINGS) ×1
DERMABOND ADVANCED .7 DNX12 (GAUZE/BANDAGES/DRESSINGS) ×2 IMPLANT
DRAPE ARM DVNC X/XI (DISPOSABLE) ×6 IMPLANT
DRAPE COLUMN DVNC XI (DISPOSABLE) ×2 IMPLANT
DRAPE DA VINCI XI ARM (DISPOSABLE) ×3
DRAPE DA VINCI XI COLUMN (DISPOSABLE) ×1
DRAPE WARM FLUID 44X44 (DRAPE) ×2 IMPLANT
DRSG OPSITE POSTOP 4X8 (GAUZE/BANDAGES/DRESSINGS) ×2 IMPLANT
DURAPREP 26ML APPLICATOR (WOUND CARE) ×3 IMPLANT
ELECT REM PT RETURN 15FT ADLT (MISCELLANEOUS) ×3 IMPLANT
GLOVE BIO SURGEON STRL SZ7 (GLOVE) ×9 IMPLANT
GLOVE BIOGEL PI IND STRL 7.0 (GLOVE) ×4 IMPLANT
GLOVE BIOGEL PI INDICATOR 7.0 (GLOVE) ×2
IRRIG SUCT STRYKERFLOW 2 WTIP (MISCELLANEOUS) ×3
IRRIGATION SUCT STRKRFLW 2 WTP (MISCELLANEOUS) ×2 IMPLANT
LEGGING LITHOTOMY PAIR STRL (DRAPES) IMPLANT
MANIPULATOR ADVINCU DEL 2.5 PL (MISCELLANEOUS) ×2 IMPLANT
MANIPULATOR ADVINCU DEL 3.0 PL (MISCELLANEOUS) IMPLANT
MANIPULATOR ADVINCU DEL 3.5 PL (MISCELLANEOUS) IMPLANT
MANIPULATOR ADVINCU DEL 4.0 PL (MISCELLANEOUS) IMPLANT
NEEDLE INSUFFLATION 120MM (ENDOMECHANICALS) ×3 IMPLANT
OBTURATOR OPTICAL STANDARD 8MM (TROCAR) ×1
OBTURATOR OPTICAL STND 8 DVNC (TROCAR) ×2
OBTURATOR OPTICALSTD 8 DVNC (TROCAR) ×2 IMPLANT
PACK ROBOT WH (CUSTOM PROCEDURE TRAY) ×3 IMPLANT
PACK ROBOTIC GOWN (GOWN DISPOSABLE) ×3 IMPLANT
PACK TRENDGUARD 450 HYBRID PRO (MISCELLANEOUS) ×1 IMPLANT
PAD PREP 24X48 CUFFED NSTRL (MISCELLANEOUS) ×3 IMPLANT
PORT ACCESS TROCAR AIRSEAL 12 (TROCAR) ×1 IMPLANT
PORT ACCESS TROCAR AIRSEAL 5M (TROCAR) ×1
POSITIONER SURGICAL ARM (MISCELLANEOUS) ×6 IMPLANT
RETRACTOR WND ALEXIS 25 LRG (MISCELLANEOUS) ×1 IMPLANT
RTRCTR WOUND ALEXIS 25CM LRG (MISCELLANEOUS) ×3
SEAL CANN UNIV 5-8 DVNC XI (MISCELLANEOUS) ×6 IMPLANT
SEAL XI 5MM-8MM UNIVERSAL (MISCELLANEOUS) ×3
SET CYSTO W/LG BORE CLAMP LF (SET/KITS/TRAYS/PACK) ×1 IMPLANT
SET TRI-LUMEN FLTR TB AIRSEAL (TUBING) ×3 IMPLANT
SPONGE LAP 18X18 RF (DISPOSABLE) ×4 IMPLANT
SUT DVC VLOC 180 0 12IN GS21 (SUTURE)
SUT PLAIN 2 0 XLH (SUTURE) ×2 IMPLANT
SUT VIC AB 0 CT1 36 (SUTURE) ×4 IMPLANT
SUT VIC AB 2-0 CT1 27 (SUTURE) ×12
SUT VIC AB 2-0 CT1 TAPERPNT 27 (SUTURE) ×4 IMPLANT
SUT VIC AB 2-0 CT2 27 (SUTURE) ×4 IMPLANT
SUT VIC AB 2-0 UR6 27 (SUTURE) IMPLANT
SUT VICRYL RAPIDE 3 0 (SUTURE) ×6 IMPLANT
SUT VLOC 180 0 9IN  GS21 (SUTURE)
SUT VLOC 180 0 9IN GS21 (SUTURE) ×1 IMPLANT
SUTURE DVC VLC 180 0 12IN GS21 (SUTURE) IMPLANT
TOWEL OR 17X26 10 PK STRL BLUE (TOWEL DISPOSABLE) ×3 IMPLANT
TRENDGUARD 450 HYBRID PRO PACK (MISCELLANEOUS) ×3
TROCAR PORT AIRSEAL 5X120 (TROCAR) ×1 IMPLANT
TROCAR PORT AIRSEAL 8X120 (TROCAR) ×2 IMPLANT
WATER STERILE IRR 1000ML POUR (IV SOLUTION) ×3 IMPLANT

## 2018-05-10 NOTE — Op Note (Addendum)
05/10/2018  6:44 PM  PATIENT:  Melissa Liu  32 y.o. female  PRE-OPERATIVE DIAGNOSIS:  cornual ectopic  POST-OPERATIVE DIAGNOSIS:  cornual ectopic  PROCEDURE:  Procedure(s) with comments: DIAGNOSTIC LAPAROSCOPY,OPEN LAPAROTOMY,LEFT OOPHORECTOMY,LEFT SALPINGECTOMY,LEFT ECTOPIC CORNUAL PREGNANCY EXCISION (Left) - CORNUAL RESECTION OF ECTOPIC PREGNANCY  SURGEON:  Surgeon(s) and Role:    Bobbye Charleston, MD - Primary  ASSISTANTS: Jerelyn Charles, MD   ANESTHESIA:   general  EBL:  600 mL   LOCAL MEDICATIONS USED:  OTHER Ropivicaine, Vasopressin, Interceed  SPECIMEN:  Source of Specimen:  L Ovary, L tube. L cornua of uterus, ectopic pregnancy, uterin contents  DISPOSITION OF SPECIMEN:  PATHOLOGY  COUNTS:  YES  TOURNIQUET:  * No tourniquets in log *  DICTATION: .Note written in [place]: Admit to inpatient   PATIENT DISPOSITION:  PACU - hemodynamically stable.   Delay start of Pharmacological VTE agent (>24hrs) due to surgical blood loss or risk of bleeding: not applicable  Complications: none.  Medications:   As above.  Findings:Normal size uterus with bulge in left cornua, dusky and boggy.  Ectopic was removed from this area.  Rest of endometrial cavity, which was opened, was clear and normal in shape.  Normal tubes were seen and R ovary was normal with only a 2 cm corpus luteum cyst seen.  The L ovary was not able to be identified separate from the large 20 cm mass that was in it's place. The mass was hard in texture.  There was a fair amount of blood in the abdomen on entry to peritoneum.  The RUQ was inspected before closing and the trocar site was stable and no additional bleeding seen there.   Technique:  After adequate general anesthesia was achieved, the patient was prepped and draped in the usual sterile fashion after the foley had been placed.  A speculum was placed in the vagina and a small amount of uterine contents was removed from the os.  A Kohn  cannula was attempt to be place in the os but the cervix was dilated and soft and would not support it.  The 2.5 Koh ring advincula was then placed.   Attention was turned to the abdomen where it was decided to do an upper RUQ entry since the uterus appeared to be enlarged from a fibroid.  A one cm incision was made in the appropriate place and the veress inserted without withdrawal of bowel contents or blood.  The abdomen was insufflated and an attempt was made to introduce the 12 mm trocar but could not get through the peritoneum with the blunt trocar.  We then made an incision above the umbilicus and introduce the veress without bowel contents or blood being seen.  The 8.5 mm trocar was placed and the camera placed into the abdomen. The camera revealed a structure that appeared to be attached to the uterus at the fundus right at the camera and a large amount of blood in her abdomen.  Several attempts were made to place the 12 mm airseal trocar but we could not get it through safely.  The 8.5 trocar was introduced but still visualization was poor and we felt it was best to proceed at that point with open procedure.  After appropriate counts were conducted, A Pfannenstiel incision was made with the scalpel and carried down to the fascia.  The fascia was incised with the scalpel in a transverse manner and extended in a transverse curvilinear manner.  The rectus muscles  were split in the midline and a bowel free portion of the peritoneum was tented up.  It was entered into with the hemostat and extended in a superior and inferior manner with good visualization of the bowel and bladder.   The large structure that appeared close to the umbilicus was grasped with a towel clip and brought through the incision.  We followed all the structures around it to the uterus, tube and round ligament but the structure itself then appeared to be the ovary, or part of the L ovary.  An independent ovary on the L could not be  identified.  The right ovary was normal except for a corpus luteum cyst and the tube and cornua of the uterus were normal as well.  The L cornua was dusky, and distended compared to the R.  The ovarian mass measured over 20 cm, was firm and appeared to be connected only through the IP and uterine ovarian ligaments besides the mesosalpinx. We made the decision to remove this structure.    A heiney was placed on the uterine ovarian ligament and on the IP ligament.  Both were incised with mayos and then ligated with both a free tie and a stitch.  The rest of the mesosalpinx was removed with successive bites of kellys with each pedicle being secured with a stitch of 0 vicryl. The structure was removed and handed off to pathology.  After inspecting the remaining pedicles, we felt that there may be some residual ovarian tissue in that area.   Attention then turned to the L cornua.  An incision was made transversely over the bulge until a sac like area could be identified.  This area was dissected out carefully and the tube into the cornua was seen clotted with blood and cut.  The corner was removed and the endometria cavity was entered into.  The ectopic was seen at the corner and removed.  A finger sweep of the cavity revealed no other masses or distortions. The defect was closed with 0 vicryl figure of eights followed by two baseball stitches of 2-0 vicryl.  Hemostasis was achieved.   After discussion with my partner, we decided that there would be no more connection from the tube to the uterus and that she would only be at risk for ectopic in that tube in the future.  We elected to remove the L tube in its entirety.  The tube was removed with successive bites of a kelly being incised with cautery and then secured with stitches or in the middle, with cautery.  Interceed was placed over the L side of the uterus and Ropivicaine placed in the abdomen.   Irrigation was performed and hemostasis was assured.  The  instruments and laps were removed from the abdominal cavity and the peritoneum was closed with a running stitch of 2-vicryl which incorporated the rectus muscles as a separate layer.  The fascia was closed with 0-vicryl in a running manner.  The subcutaneous tissue was closed with interrupted stitches of 3-0 plain gut after hemostasis was achieved with the bovie and irrigation.  The skin was closed with 3-0 vicryl sub-q with a keith needle and dermabond.  Pt tolerated the procedure well and was transferred to the recovery room in stable condition.

## 2018-05-10 NOTE — Progress Notes (Signed)
There has been no change in the patients history, status or exam since the history and physical.  Vitals:   05/10/18 1549  BP: (!) 145/92  Pulse: (!) 109  Resp: 18  Temp: 97.8 F (36.6 C)  TempSrc: Oral  SpO2: 100%  Weight: 80.2 kg  Height: 5\' 2"  (1.575 m)    No results found for this or any previous visit (from the past 72 hour(s)). T&S, CBC pending.   Felisia Balcom A

## 2018-05-10 NOTE — Progress Notes (Signed)
I spoke with Dr Philis Pique and patient is to be admitted postop to [facility].  Clarified this with the physician.  I called and talked with Margaretha Sheffield, RN at [facility] PACU and gave her this information.  Margaretha Sheffield is aware that this patient will be recovered in the main PACU at [facility] and then transferred over to [facility] to spend the night accordingly.

## 2018-05-10 NOTE — Anesthesia Preprocedure Evaluation (Addendum)
Anesthesia Evaluation  Patient identified by MRN, date of birth, ID band Patient awake    Reviewed: Allergy & Precautions, NPO status , Patient's Chart, lab work & pertinent test results  Airway Mallampati: II  TM Distance: >3 FB Neck ROM: Full    Dental no notable dental hx.    Pulmonary neg pulmonary ROS,    Pulmonary exam normal breath sounds clear to auscultation       Cardiovascular negative cardio ROS   Rhythm:Regular Rate:Tachycardia     Neuro/Psych negative neurological ROS  negative psych ROS   GI/Hepatic negative GI ROS, Neg liver ROS,   Endo/Other  negative endocrine ROS  Renal/GU negative Renal ROS  negative genitourinary   Musculoskeletal negative musculoskeletal ROS (+)   Abdominal   Peds negative pediatric ROS (+)  Hematology negative hematology ROS (+)   Anesthesia Other Findings   Reproductive/Obstetrics negative OB ROS                            Anesthesia Physical Anesthesia Plan  ASA: I  Anesthesia Plan: General   Post-op Pain Management:    Induction: Intravenous  PONV Risk Score and Plan: 3 and Ondansetron, Dexamethasone, Midazolam and Scopolamine patch - Pre-op  Airway Management Planned: Oral ETT  Additional Equipment:   Intra-op Plan:   Post-operative Plan: Extubation in OR  Informed Consent: I have reviewed the patients History and Physical, chart, labs and discussed the procedure including the risks, benefits and alternatives for the proposed anesthesia with the patient or authorized representative who has indicated his/her understanding and acceptance.   Dental advisory given  Plan Discussed with: CRNA and Surgeon  Anesthesia Plan Comments:         Anesthesia Quick Evaluation

## 2018-05-10 NOTE — H&P (Signed)
32 y.o. G1P0 with about 6 week MAB in what appears to be cornual of uterus on L.  Pt presented a week and half ago for viability scan and was found to have an IUP measuring about 6 weeks (when pt was about 9 wbd) and no FHTs.  There was a large amount of free fluid in abdomen but no heterotopic pregnancy seen.  Pt was stable and asymptomatic; at that time we felt it was a simple MAB and she was offered cytotec.  She defferered initially but two days later accepted cytotec.  Since then she has had some irregular bleeding but more bloating, cramping and discomfort.  She has not seen anything passed.  Today US shows possible cornual ectopic in  R side of the uterus.  The POC are still present as well as the free fluid.  Past Medical History:  Diagnosis Date  . Hyperlipidemia   No past surgical history on file.  Social History   Socioeconomic History  . Marital status: Single    Spouse name: Not on file  . Number of children: Not on file  . Years of education: Not on file  . Highest education level: Not on file  Occupational History  . Not on file  Social Needs  . Financial resource strain: Not on file  . Food insecurity:    Worry: Not on file    Inability: Not on file  . Transportation needs:    Medical: Not on file    Non-medical: Not on file  Tobacco Use  . Smoking status: Never Smoker  . Smokeless tobacco: Never Used  Substance and Sexual Activity  . Alcohol use: Yes    Comment: occ  . Drug use: No  . Sexual activity: Yes  Lifestyle  . Physical activity:    Days per week: Not on file    Minutes per session: Not on file  . Stress: Not on file  Relationships  . Social connections:    Talks on phone: Not on file    Gets together: Not on file    Attends religious service: Not on file    Active member of club or organization: Not on file    Attends meetings of clubs or organizations: Not on file    Relationship status: Not on file  . Intimate partner violence:    Fear of  current or ex partner: Not on file    Emotionally abused: Not on file    Physically abused: Not on file    Forced sexual activity: Not on file  Other Topics Concern  . Not on file  Social History Narrative  . Not on file    No current facility-administered medications on file prior to encounter.    Current Outpatient Medications on File Prior to Encounter  Medication Sig Dispense Refill  . acetaminophen (TYLENOL) 650 MG CR tablet Take 1,300 mg by mouth every 8 (eight) hours as needed for pain.    . medroxyPROGESTERone (PROVERA) 10 MG tablet Take 10 mg by mouth daily.  4  . oxyCODONE-acetaminophen (PERCOCET/ROXICET) 5-325 MG tablet Take 1 tablet by mouth every 4 (four) hours as needed for severe pain. 15 tablet 0  . Prenatal Vit-Fe Fumarate-FA (MULTIVITAMIN-PRENATAL) 27-0.8 MG TABS tablet Take 2 tablets by mouth daily at 12 noon.    Marland Kitchen PRESCRIPTION MEDICATION Take 1 tablet by mouth once. Muscle relaxant that her husband gave her      Allergies  Allergen Reactions  . Coconut Oil Itching and Rash  There were no vitals filed for this visit.  Lungs: clear to ascultation Cor:  RRR Abdomen:  soft, nontender, nondistended. Ex:  no cords, erythema Pelvic:  Tender today on [place]; pelvic differed to OR.  A:  Probable cornual ectopic pregnancy without FHTs.  D/w pt need for operation if cornual.  Will do hysteroscopy first to determine if pregnancy is really NOT in uterus.  D/w pt doing the removal of the cornual ectopic robotically to minimize manipulation and quicker recovery than open surgery.  Pt understands we may still have to open if unable to get ectopic robotically or if she has excessive bleeding.  Pt understands she will like lose function of the affected tube as there is unlikely any way to repair the area while maintaining blood loss where she would maintain function.  She understands this may reduce her fertility but she still would be able to get pregnant if the other tube is  undamaged.  She understands possible need for blood transfusion if hemorrhage and small possibility of hysterectomy if unable to stop the bleeding before it risks her life.  She voiced understanding of all and agrees to proceed. Her blood type is O+.  P: P: All risks, benefits and alternatives d/w patient and she desires to proceed.  ERAS protocol and will receive preop antibiotics and SCDs during the operation.   Pt to have extended recovery but will go home tomorrow if eating, ambulating, voiding and pain control is good.  Feliciano Wynter A

## 2018-05-10 NOTE — Brief Op Note (Signed)
05/10/2018  6:44 PM  PATIENT:  Melissa Liu  32 y.o. female  PRE-OPERATIVE DIAGNOSIS:  cornual ectopic  POST-OPERATIVE DIAGNOSIS:  cornual ectopic  PROCEDURE:  Procedure(s) with comments: DIAGNOSTIC LAPAROSCOPY,OPEN LAPAROTOMY,LEFT OOPHORECTOMY,LEFT SALPINGECTOMY,LEFT ECTOPIC CORNUAL PREGNANCY EXCISION (Left) - CORNUAL RESECTION OF ECTOPIC PREGNANCY  SURGEON:  Surgeon(s) and Role:    Bobbye Charleston, MD - Primary  ASSISTANTS: Jerelyn Charles, MD   ANESTHESIA:   general  EBL:  600 mL   LOCAL MEDICATIONS USED:  OTHER Ropivicaine, Vasopressin, Interceed  SPECIMEN:  Source of Specimen:  L Ovary, L tube. L cornua of uterus, ectopic pregnancy, uterin contents  DISPOSITION OF SPECIMEN:  PATHOLOGY  COUNTS:  YES  TOURNIQUET:  * No tourniquets in log *  DICTATION: .Note written in [place]: Admit to inpatient   PATIENT DISPOSITION:  PACU - hemodynamically stable.   Delay start of Pharmacological VTE agent (>24hrs) due to surgical blood loss or risk of bleeding: not applicable

## 2018-05-10 NOTE — Anesthesia Procedure Notes (Signed)
Procedure Name: Intubation Date/Time: 05/10/2018 4:28 PM Performed by: Devory Mckinzie D, CRNA Pre-anesthesia Checklist: Patient identified, Emergency Drugs available, Suction available and Patient being monitored Patient Re-evaluated:Patient Re-evaluated prior to induction Oxygen Delivery Method: Circle system utilized Preoxygenation: Pre-oxygenation with 100% oxygen Induction Type: IV induction, Rapid sequence and Cricoid Pressure applied Laryngoscope Size: Mac and 4 Grade View: Grade I Tube type: Oral Tube size: 7.5 mm Number of attempts: 1 Airway Equipment and Method: Stylet Placement Confirmation: ETT inserted through vocal cords under direct vision,  positive ETCO2 and breath sounds checked- equal and bilateral Secured at: 21 cm Tube secured with: Tape Dental Injury: Teeth and Oropharynx as per pre-operative assessment

## 2018-05-10 NOTE — Transfer of Care (Signed)
Immediate Anesthesia Transfer of Care Note  Patient: Melissa Liu  Procedure(s) Performed: DIAGNOSTIC LAPAROSCOPY,OPEN LAPAROTOMY,LEFT OOPHORECTOMY,LEFT SALPINGECTOMY,LEFT ECTOPIC CORNUAL PREGNANCY EXCISION (Left Abdomen)  Patient Location: PACU  Anesthesia Type:General  Level of Consciousness: awake, alert  and oriented  Airway & Oxygen Therapy: Patient Spontanous Breathing and Patient connected to face mask oxygen  Post-op Assessment: Report given to RN and Post -op Vital signs reviewed and stable  Post vital signs: Reviewed and stable  Last Vitals:  Vitals Value Taken Time  BP 124/90 05/10/2018  7:05 PM  Temp    Pulse 117 05/10/2018  7:07 PM  Resp 19 05/10/2018  7:07 PM  SpO2 100 % 05/10/2018  7:07 PM  Vitals shown include unvalidated device data.  Last Pain:  Vitals:   05/10/18 1549  TempSrc: Oral  PainSc: 7          Complications: No apparent anesthesia complications

## 2018-05-11 ENCOUNTER — Encounter (HOSPITAL_COMMUNITY): Payer: Self-pay | Admitting: Obstetrics and Gynecology

## 2018-05-11 LAB — COMPREHENSIVE METABOLIC PANEL
ALK PHOS: 34 U/L — AB (ref 38–126)
ALT: 16 U/L (ref 0–44)
AST: 26 U/L (ref 15–41)
Albumin: 3 g/dL — ABNORMAL LOW (ref 3.5–5.0)
Anion gap: 5 (ref 5–15)
BUN: 8 mg/dL (ref 6–20)
CO2: 24 mmol/L (ref 22–32)
CREATININE: 0.64 mg/dL (ref 0.44–1.00)
Calcium: 8.5 mg/dL — ABNORMAL LOW (ref 8.9–10.3)
Chloride: 110 mmol/L (ref 98–111)
GFR calc Af Amer: >60 mL/min (ref >60)
GLUCOSE: 148 mg/dL — AB (ref 70–99)
POTASSIUM: 4 mmol/L (ref 3.5–5.1)
Sodium: 139 mmol/L (ref 135–145)
TOTAL PROTEIN: 5.3 g/dL — AB (ref 6.5–8.1)
Total Bilirubin: 0.4 mg/dL (ref 0.3–1.2)

## 2018-05-11 LAB — CBC
HCT: 28.6 % — ABNORMAL LOW (ref 36.0–46.0)
HEMOGLOBIN: 9.5 g/dL — AB (ref 12.0–15.0)
MCH: 27.9 pg (ref 26.0–34.0)
MCHC: 33.2 g/dL (ref 30.0–36.0)
MCV: 84.1 fL (ref 78.0–100.0)
Platelets: 141 10*3/uL — ABNORMAL LOW (ref 150–400)
RBC: 3.4 MIL/uL — ABNORMAL LOW (ref 3.87–5.11)
RDW: 13.4 % (ref 11.5–15.5)
WBC: 6.7 10*3/uL (ref 4.0–10.5)

## 2018-05-11 LAB — ABO/RH: ABO/RH(D): O POS

## 2018-05-11 MED ORDER — OXYCODONE-ACETAMINOPHEN 5-325 MG PO TABS: 1.0000 | ORAL_TABLET | ORAL | 0 refills | Status: DC | PRN

## 2018-05-11 MED ORDER — ONDANSETRON HCL 4 MG PO TABS: 4.0000 mg | ORAL_TABLET | Freq: Four times a day (QID) | ORAL | 0 refills | Status: DC | PRN

## 2018-05-11 MED ORDER — IBUPROFEN 800 MG PO TABS: 800.0000 mg | ORAL_TABLET | Freq: Three times a day (TID) | ORAL | 0 refills | Status: DC | PRN

## 2018-05-11 MED FILL — ONDANSETRON HCL 4 MG TABLET: 4 | 5 days supply | Qty: 20 | Fill #0

## 2018-05-11 MED FILL — IBUPROFEN 800 MG TAB: 800 | 10 days supply | Qty: 30 | Fill #0

## 2018-05-11 NOTE — Progress Notes (Signed)
Results for orders placed or performed during the hospital encounter of 05/10/18 (from the past 24 hour(s))  CBC     Status: Abnormal   Collection Time: 05/10/18  3:49 PM  Result Value Ref Range   WBC 7.8 4.0 - 10.5 K/uL   RBC 4.07 3.87 - 5.11 MIL/uL   Hemoglobin 11.5 (L) 12.0 - 15.0 g/dL   HCT 35.1 (L) 36.0 - 46.0 %   MCV 86.2 78.0 - 100.0 fL   MCH 28.3 26.0 - 34.0 pg   MCHC 32.8 30.0 - 36.0 g/dL   RDW 13.2 11.5 - 15.5 %   Platelets 291 150 - 400 K/uL  Type and screen     Status: None   Collection Time: 05/10/18  3:49 PM  Result Value Ref Range   ABO/RH(D) O POS    Antibody Screen NEG    Sample Expiration      05/13/2018 Performed at [facility], [place] [address] [place], [place] [address]   ABO/Rh     Status: None   Collection Time: 05/10/18  4:12 PM  Result Value Ref Range   ABO/RH(D)      O POS Performed at [facility], [place] [address] [place], [place] [address]   CBC     Status: Abnormal   Collection Time: 05/11/18  4:42 AM  Result Value Ref Range   WBC 6.7 4.0 - 10.5 K/uL   RBC 3.40 (L) 3.87 - 5.11 MIL/uL   Hemoglobin 9.5 (L) 12.0 - 15.0 g/dL   HCT 28.6 (L) 36.0 - 46.0 %   MCV 84.1 78.0 - 100.0 fL   MCH 27.9 26.0 - 34.0 pg   MCHC 33.2 30.0 - 36.0 g/dL   RDW 13.4 11.5 - 15.5 %   Platelets 141 (L) 150 - 400 K/uL  Comprehensive metabolic panel     Status: Abnormal   Collection Time: 05/11/18  4:42 AM  Result Value Ref Range   Sodium 139 135 - 145 mmol/L   Potassium 4.0 3.5 - 5.1 mmol/L   Chloride 110 98 - 111 mmol/L   CO2 24 22 - 32 mmol/L   Glucose, Bld 148 (H) 70 - 99 mg/dL   BUN 8 6 - 20 mg/dL   Creatinine, Ser 0.64 0.44 - 1.00 mg/dL   Calcium 8.5 (L) 8.9 - 10.3 mg/dL   Total Protein 5.3 (L) 6.5 - 8.1 g/dL   Albumin 3.0 (L) 3.5 - 5.0 g/dL   AST 26 15 - 41 U/L   ALT 16 0 - 44 U/L   Alkaline Phosphatase 34 (L) 38 - 126 U/L   Total Bilirubin 0.4 0.3 - 1.2 mg/dL   GFR calc non Af Amer >60 >60 mL/min   GFR calc Af Amer  >60 >60 mL/min   Anion gap 5 5 - 15   Pt H/H stable- consider D/C this pm if pulse and vitals remain stable and pt tolerating reg.

## 2018-05-11 NOTE — Progress Notes (Signed)
Patient is eating, ambulating, not voiding yet.  Pain control is good.  Vitals:   05/11/18 0004 05/11/18 0008 05/11/18 0459 05/11/18 0551  BP:  (!) 124/97  123/76  Pulse:  (!) 110  (!) 110  Resp: 19 18 20 18   Temp:  98 F (36.7 C)  99 F (37.2 C)  TempSrc:  Oral  Oral  SpO2: 100% 100% 100% 100%  Weight:      Height:        lungs:   clear to auscultation cor:    RRR Abdomen:  soft, appropriate tenderness, incisions intact and without erythema or exudate. ex:    no cords   Lab Results  Component Value Date   WBC 7.8 05/10/2018   HGB 11.5 (L) 05/10/2018   HCT 35.1 (L) 05/10/2018   MCV 86.2 05/10/2018   PLT 291 05/10/2018    A/P Post op E-lap for cornual ectopic and large mass.  Pt doing well this am.  Routine care.  Pt had a lot of blood in her abdomen before surgery; CBC pending this am.  Foley out.  Expect d/c per plan.

## 2018-05-12 NOTE — Discharge Summary (Signed)
Physician Discharge Summary  Patient ID: Melissa Liu MRN: [id number] DOB/AGE: September 15, 1985 32 y.o.  Admit date: 05/10/2018 Discharge date: 05/12/2018  Admission Diagnoses:cornual ectopic Discharge Diagnoses: same plus large ovarian mass Active Problems:   Postoperative state   Discharged Condition: good  Hospital Course: Pt admitted for MIS surgery for cornual ectopic.   A large mass originating from the L Ovarian site was preventing laparoscopic visualization of ectopic and E lap was performed.  She then underwent a presumed LSO and cornual resection of ectopic.  Pt did well and was d/ced to home eating ambulating and voiding on PO2  Consults: None  Significant Diagnostic Studies: labs:  Results for orders placed or performed during the hospital encounter of 05/10/18 (from the past 48 hour(s))  CBC     Status: Abnormal   Collection Time: 05/10/18  3:49 PM  Result Value Ref Range   WBC 7.8 4.0 - 10.5 K/uL   RBC 4.07 3.87 - 5.11 MIL/uL   Hemoglobin 11.5 (L) 12.0 - 15.0 g/dL   HCT 35.1 (L) 36.0 - 46.0 %   MCV 86.2 78.0 - 100.0 fL   MCH 28.3 26.0 - 34.0 pg   MCHC 32.8 30.0 - 36.0 g/dL   RDW 13.2 11.5 - 15.5 %   Platelets 291 150 - 400 K/uL    Comment: Performed at [facility], [place] [address], [place], [place] [address]  Type and screen     Status: None   Collection Time: 05/10/18  3:49 PM  Result Value Ref Range   ABO/RH(D) O POS    Antibody Screen NEG    Sample Expiration      05/13/2018 Performed at [facility], [place] [address], [place], [place] [address]   ABO/Rh     Status: None   Collection Time: 05/10/18  4:12 PM  Result Value Ref Range   ABO/RH(D)      O POS Performed at [facility], [place] [address] [place], [place] [address]   CBC     Status: Abnormal   Collection Time: 05/11/18  4:42 AM  Result Value Ref Range   WBC 6.7 4.0 - 10.5 K/uL   RBC 3.40 (L) 3.87 - 5.11 MIL/uL   Hemoglobin 9.5 (L) 12.0 -  15.0 g/dL   HCT 28.6 (L) 36.0 - 46.0 %   MCV 84.1 78.0 - 100.0 fL   MCH 27.9 26.0 - 34.0 pg   MCHC 33.2 30.0 - 36.0 g/dL   RDW 13.4 11.5 - 15.5 %   Platelets 141 (L) 150 - 400 K/uL    Comment: SPECIMEN CHECKED FOR CLOTS DELTA CHECK NOTED Performed at [facility] [address] [place], [place] [address]   Comprehensive metabolic panel     Status: Abnormal   Collection Time: 05/11/18  4:42 AM  Result Value Ref Range   Sodium 139 135 - 145 mmol/L   Potassium 4.0 3.5 - 5.1 mmol/L   Chloride 110 98 - 111 mmol/L   CO2 24 22 - 32 mmol/L   Glucose, Bld 148 (H) 70 - 99 mg/dL   BUN 8 6 - 20 mg/dL   Creatinine, Ser 0.64 0.44 - 1.00 mg/dL   Calcium 8.5 (L) 8.9 - 10.3 mg/dL   Total Protein 5.3 (L) 6.5 - 8.1 g/dL   Albumin 3.0 (L) 3.5 - 5.0 g/dL   AST 26 15 - 41 U/L   ALT 16 0 - 44 U/L   Alkaline Phosphatase 34 (L)  38 - 126 U/L   Total Bilirubin 0.4 0.3 - 1.2 mg/dL   GFR calc non Af Amer >60 >60 mL/min   GFR calc Af Amer >60 >60 mL/min    Comment: (NOTE) The eGFR has been calculated using the CKD EPI equation. This calculation has not been validated in all clinical situations. eGFR's persistently <60 mL/min signify possible Chronic Kidney Disease.    Anion gap 5 5 - 15    Comment: Performed at [facility], [place] [address] [place], [place] [address]    Treatments: surgery: dx scope, exploratory laparotomy, LSO and removal of mass, cornual resection of ectopic on L.  Discharge Exam: Blood pressure 130/77, pulse (!) 119, temperature (!) 97.5 F (36.4 C), temperature source Oral, resp. rate 18, height 5' 2"  (1.575 m), weight 80.2 kg, SpO2 100 %.   Disposition: Discharge disposition: 01-Home or Self Care       Discharge Instructions    Call MD for:  temperature >100.4   Complete by:  As directed    Diet - low sodium heart healthy   Complete by:  As directed    Discharge instructions   Complete by:  As directed    No driving on  narcotics, no sexual activity for 2 weeks.   Increase activity slowly   Complete by:  As directed    May shower / Bathe   Complete by:  As directed    Shower, no bath for 2 weeks.   Remove dressing in 24 hours   Complete by:  As directed    Sexual Activity Restrictions   Complete by:  As directed    No sexual activity for 2 weeks.     Allergies as of 05/12/2018      Reactions   Coconut Oil Itching, Rash      Medication List    STOP taking these medications   hydrOXYzine 25 MG capsule Commonly known as:  VISTARIL     TAKE these medications   ibuprofen 800 MG tablet Commonly known as:  ADVIL,MOTRIN Take 1 tablet (800 mg total) by mouth every 8 (eight) hours as needed (mild pain). What changed:    when to take this  reasons to take this   multivitamin with minerals Tabs tablet Take 1 tablet by mouth daily.   ondansetron 4 MG tablet Commonly known as:  ZOFRAN Take 1 tablet (4 mg total) by mouth every 6 (six) hours as needed for nausea.   oxyCODONE-acetaminophen 5-325 MG tablet Commonly known as:  PERCOCET/ROXICET Take 1 tablet by mouth every 4 (four) hours as needed for severe pain.      Follow-up Information    Bobbye Charleston, MD Follow up in 2 week(s).   Specialty:  Obstetrics and Gynecology Contact information: [place] [address] [phone]           Signed: Jaesean Litzau A 05/12/2018, 8:42 AM

## 2018-05-12 NOTE — Progress Notes (Signed)
Patient is eating, ambulating, and voiding.  Pain control is good.  Vitals:   05/11/18 1823 05/11/18 1915 05/11/18 2151 05/12/18 0555  BP: 127/79  (!) 131/94 130/77  Pulse: (!) 120 (!) 108 (!) 128 (!) 119  Resp: 16  16 18   Temp: 99.1 F (37.3 C)  98 F (36.7 C) (!) 97.5 F (36.4 C)  TempSrc: Oral  Oral Oral  SpO2: 100%  100% 100%  Weight:      Height:        lungs:   clear to auscultation cor:    RRR Abdomen:  soft, appropriate tenderness, incisions intact and without erythema or exudate. ex:    no cords   Lab Results  Component Value Date   WBC 6.7 05/11/2018   HGB 9.5 (L) 05/11/2018   HCT 28.6 (L) 05/11/2018   MCV 84.1 05/11/2018   PLT 141 (L) 05/11/2018    A/P  Routine care.  Expect d/c per plan.

## 2018-05-12 NOTE — Progress Notes (Signed)
Pt discharged to home. PIV to left hand removed. Pt voiding, passing gas, pain well controlled. Pt stable and ready for DC to home.

## 2018-05-14 NOTE — Anesthesia Postprocedure Evaluation (Signed)
Anesthesia Post Note  Patient: Melissa Liu  Procedure(s) Performed: DIAGNOSTIC LAPAROSCOPY,OPEN LAPAROTOMY,LEFT OOPHORECTOMY,LEFT SALPINGECTOMY,LEFT ECTOPIC CORNUAL PREGNANCY EXCISION (Left Abdomen)     Patient location during evaluation: PACU Anesthesia Type: General Level of consciousness: awake and alert Pain management: pain level controlled Vital Signs Assessment: post-procedure vital signs reviewed and stable Respiratory status: spontaneous breathing, nonlabored ventilation, respiratory function stable and patient connected to nasal cannula oxygen Cardiovascular status: blood pressure returned to baseline and stable Postop Assessment: no apparent nausea or vomiting Anesthetic complications: no    Last Vitals:  Vitals:   05/11/18 2151 05/12/18 0555  BP: (!) 131/94 130/77  Pulse: (!) 128 (!) 119  Resp: 16 18  Temp: 36.7 C (!) 36.4 C  SpO2: 100% 100%    Last Pain:  Vitals:   05/12/18 1100  TempSrc:   PainSc: 3                  Ervin Rothbauer S

## 2018-06-05 DIAGNOSIS — R769 Abnormal immunological finding in serum, unspecified: Secondary | ICD-10-CM | POA: Diagnosis not present

## 2018-06-05 MED FILL — traMADol HCL 50 MG TABS: 50 | 7 days supply | Qty: 30 | Fill #0

## 2018-06-28 MED FILL — IBUPROFEN 800 MG TAB: 800 | 30 days supply | Qty: 90 | Fill #1

## 2018-06-28 MED FILL — HYDROXYZINE PAM 25 MG CAP: 25 | 5 days supply | Qty: 30 | Fill #1

## 2018-08-13 DIAGNOSIS — H5213 Myopia, bilateral: Secondary | ICD-10-CM | POA: Diagnosis not present

## 2018-10-12 MED FILL — IBUPROFEN 800 MG TAB: 800 | 30 days supply | Qty: 90 | Fill #2

## 2019-04-02 DIAGNOSIS — Z3202 Encounter for pregnancy test, result negative: Secondary | ICD-10-CM | POA: Diagnosis not present

## 2019-04-02 DIAGNOSIS — Z01419 Encounter for gynecological examination (general) (routine) without abnormal findings: Secondary | ICD-10-CM | POA: Diagnosis not present

## 2019-04-03 DIAGNOSIS — Z0000 Encounter for general adult medical examination without abnormal findings: Secondary | ICD-10-CM | POA: Diagnosis not present

## 2019-04-15 DIAGNOSIS — R7989 Other specified abnormal findings of blood chemistry: Secondary | ICD-10-CM | POA: Diagnosis not present

## 2019-04-15 DIAGNOSIS — D538 Other specified nutritional anemias: Secondary | ICD-10-CM | POA: Diagnosis not present

## 2019-04-15 DIAGNOSIS — D649 Anemia, unspecified: Secondary | ICD-10-CM | POA: Diagnosis not present

## 2019-04-15 DIAGNOSIS — E7800 Pure hypercholesterolemia, unspecified: Secondary | ICD-10-CM | POA: Diagnosis not present

## 2019-04-15 MED FILL — HYDROXYZINE PAMOATE 25 MG C: 25 | 8 days supply | Qty: 30 | Fill #0

## 2019-04-15 MED FILL — IBUPROFEN 800 MG TAB: 800 | 30 days supply | Qty: 90 | Fill #0

## 2019-04-30 DIAGNOSIS — Z3141 Encounter for fertility testing: Secondary | ICD-10-CM | POA: Diagnosis not present

## 2019-05-21 DIAGNOSIS — N970 Female infertility associated with anovulation: Secondary | ICD-10-CM | POA: Diagnosis not present

## 2019-05-21 MED FILL — LETROZOLE 2.5 MG TABLET: 2.5 | 5 days supply | Qty: 5 | Fill #0

## 2019-06-26 MED FILL — LETROZOLE 2.5 MG TABLET: 2.5 | 6 days supply | Qty: 6 | Fill #0

## 2019-07-01 MED FILL — IBUPROFEN 800 MG TAB: 800 | 30 days supply | Qty: 90 | Fill #1

## 2019-07-15 DIAGNOSIS — R5383 Other fatigue: Secondary | ICD-10-CM | POA: Diagnosis not present

## 2019-07-15 DIAGNOSIS — D539 Nutritional anemia, unspecified: Secondary | ICD-10-CM | POA: Diagnosis not present

## 2019-07-15 DIAGNOSIS — E559 Vitamin D deficiency, unspecified: Secondary | ICD-10-CM | POA: Diagnosis not present

## 2019-07-15 DIAGNOSIS — Z79899 Other long term (current) drug therapy: Secondary | ICD-10-CM | POA: Diagnosis not present

## 2019-07-15 DIAGNOSIS — E7800 Pure hypercholesterolemia, unspecified: Secondary | ICD-10-CM | POA: Diagnosis not present

## 2019-07-15 DIAGNOSIS — Z1159 Encounter for screening for other viral diseases: Secondary | ICD-10-CM | POA: Diagnosis not present

## 2019-07-29 MED FILL — LETROZOLE 2.5 MG TABLET: 2.5 | 6 days supply | Qty: 6 | Fill #1

## 2019-07-29 MED FILL — HYDROXYZINE PAMOATE 25 MG C: 25 | 8 days supply | Qty: 30 | Fill #1

## 2019-08-19 DIAGNOSIS — H5213 Myopia, bilateral: Secondary | ICD-10-CM | POA: Diagnosis not present

## 2019-10-31 ENCOUNTER — Other Ambulatory Visit (HOSPITAL_COMMUNITY): Payer: Self-pay | Admitting: Women's Health

## 2019-10-31 DIAGNOSIS — E288 Other ovarian dysfunction: Secondary | ICD-10-CM | POA: Diagnosis not present

## 2019-10-31 DIAGNOSIS — Z319 Encounter for procreative management, unspecified: Secondary | ICD-10-CM | POA: Diagnosis not present

## 2019-10-31 MED FILL — LETROZOLE 2.5 MG TABLET: 2.5 | 5 days supply | Qty: 15 | Fill #0

## 2019-10-31 MED FILL — metFORMIN HCL 1000 MG TABS: 1000 | 30 days supply | Qty: 60 | Fill #0

## 2019-11-26 DIAGNOSIS — Z331 Pregnant state, incidental: Secondary | ICD-10-CM | POA: Diagnosis not present

## 2019-11-26 DIAGNOSIS — Z7689 Persons encountering health services in other specified circumstances: Secondary | ICD-10-CM | POA: Diagnosis not present

## 2019-12-10 DIAGNOSIS — N925 Other specified irregular menstruation: Secondary | ICD-10-CM | POA: Diagnosis not present

## 2019-12-10 DIAGNOSIS — Z3201 Encounter for pregnancy test, result positive: Secondary | ICD-10-CM | POA: Diagnosis not present

## 2019-12-10 DIAGNOSIS — Z113 Encounter for screening for infections with a predominantly sexual mode of transmission: Secondary | ICD-10-CM | POA: Diagnosis not present

## 2019-12-10 DIAGNOSIS — Z3481 Encounter for supervision of other normal pregnancy, first trimester: Secondary | ICD-10-CM | POA: Diagnosis not present

## 2019-12-10 DIAGNOSIS — Z3480 Encounter for supervision of other normal pregnancy, unspecified trimester: Secondary | ICD-10-CM | POA: Diagnosis not present

## 2020-01-03 DIAGNOSIS — O021 Missed abortion: Secondary | ICD-10-CM | POA: Diagnosis not present

## 2020-01-03 DIAGNOSIS — O3680X9 Pregnancy with inconclusive fetal viability, other fetus: Secondary | ICD-10-CM | POA: Diagnosis not present

## 2020-01-03 MED FILL — miSOPROStol 200 MCG TABS: 200 | 1 days supply | Qty: 4 | Fill #0

## 2020-01-04 MED FILL — miSOPROStol 200 MCG TABS: 200 | 1 days supply | Qty: 4 | Fill #1

## 2020-01-07 ENCOUNTER — Inpatient Hospital Stay (HOSPITAL_COMMUNITY): Payer: 59

## 2020-01-07 ENCOUNTER — Encounter (HOSPITAL_COMMUNITY): Payer: Self-pay | Admitting: Obstetrics and Gynecology

## 2020-01-07 ENCOUNTER — Other Ambulatory Visit: Payer: Self-pay

## 2020-01-07 ENCOUNTER — Inpatient Hospital Stay (HOSPITAL_COMMUNITY)
Admission: AD | Admit: 2020-01-07 | Discharge: 2020-01-07 | Disposition: A | Discharge status: Home / Self Care | Payer: 59 | Attending: Obstetrics | Admitting: Obstetrics

## 2020-01-07 DIAGNOSIS — Z6790 Unspecified blood type, Rh positive: Secondary | ICD-10-CM

## 2020-01-07 DIAGNOSIS — O039 Complete or unspecified spontaneous abortion without complication: Secondary | ICD-10-CM | POA: Insufficient documentation

## 2020-01-07 DIAGNOSIS — R52 Pain, unspecified: Secondary | ICD-10-CM | POA: Diagnosis not present

## 2020-01-07 DIAGNOSIS — I10 Essential (primary) hypertension: Secondary | ICD-10-CM | POA: Diagnosis not present

## 2020-01-07 DIAGNOSIS — Z3A00 Weeks of gestation of pregnancy not specified: Secondary | ICD-10-CM | POA: Diagnosis not present

## 2020-01-07 DIAGNOSIS — R58 Hemorrhage, not elsewhere classified: Secondary | ICD-10-CM | POA: Diagnosis not present

## 2020-01-07 DIAGNOSIS — R0902 Hypoxemia: Secondary | ICD-10-CM | POA: Diagnosis not present

## 2020-01-07 DIAGNOSIS — O208 Other hemorrhage in early pregnancy: Secondary | ICD-10-CM | POA: Diagnosis not present

## 2020-01-07 LAB — URINALYSIS, ROUTINE W REFLEX MICROSCOPIC
Bilirubin Urine: NEGATIVE
Glucose, UA: NEGATIVE mg/dL
Ketones, ur: NEGATIVE mg/dL
Leukocytes,Ua: NEGATIVE
Nitrite: NEGATIVE
Protein, ur: 100 mg/dL — AB
RBC / HPF: >50 RBC/hpf — ABNORMAL HIGH (ref 0–5)
Specific Gravity, Urine: 1.032 — ABNORMAL HIGH (ref 1.005–1.030)
pH: 6 (ref 5.0–8.0)

## 2020-01-07 LAB — CBC
HCT: 34.2 % — ABNORMAL LOW (ref 36.0–46.0)
Hemoglobin: 10.8 g/dL — ABNORMAL LOW (ref 12.0–15.0)
MCH: 28.9 pg (ref 26.0–34.0)
MCHC: 31.6 g/dL (ref 30.0–36.0)
MCV: 91.4 fL (ref 80.0–100.0)
Platelets: 229 10*3/uL (ref 150–400)
RBC: 3.74 MIL/uL — ABNORMAL LOW (ref 3.87–5.11)
RDW: 13.7 % (ref 11.5–15.5)
WBC: 7.7 10*3/uL (ref 4.0–10.5)
nRBC: 0 % (ref 0.0–0.2)

## 2020-01-07 LAB — HCG, QUANTITATIVE, PREGNANCY: hCG, Beta Chain, Quant, S: 8535 m[IU]/mL — ABNORMAL HIGH (ref <5)

## 2020-01-07 MED ORDER — OXYCODONE HCL 5 MG PO TABS: 5.0000 mg | ORAL_TABLET | Freq: Three times a day (TID) | ORAL | 0 refills | Status: AC | PRN

## 2020-01-07 MED ORDER — OXYCODONE HCL 5 MG PO TABS
5.0000 mg | ORAL_TABLET | Freq: Once | ORAL | Status: AC
  Administered 2020-01-07: 5 mg via ORAL
  Filled 2020-01-07: qty 1

## 2020-01-07 MED ORDER — IBUPROFEN 600 MG PO TABS
600.0000 mg | ORAL_TABLET | Freq: Once | ORAL | Status: AC
  Administered 2020-01-07: 05:00:00 600 mg via ORAL
  Filled 2020-01-07: qty 1

## 2020-01-07 MED ORDER — IBUPROFEN 600 MG PO TABS
600.0000 mg | ORAL_TABLET | Freq: Four times a day (QID) | ORAL | 0 refills | Status: DC | PRN
Start: 2020-01-07 — End: 2022-08-23

## 2020-01-07 MED FILL — IBUPROFEN 600 MG TABLET: 600 | 7 days supply | Qty: 30 | Fill #0

## 2020-01-07 MED FILL — oxyCODONE HCL 5 MG TABS: 5 | 2 days supply | Qty: 8 | Fill #0

## 2020-01-07 NOTE — MAU Provider Note (Signed)
History     CSN: [id number]  Arrival date and time: 01/07/20 [id number]   First Provider Initiated Contact with Patient 01/07/20 [phone]      Chief Complaint  Patient presents with  . Miscarriage   Ms. Melissa Liu is a 34 y.o. G2P0020 at Unknown who presents to MAU for vaginal bleeding which began 4 days ago. Patient reports she went to her OB's office on 01/03/2020 for a regular visit to check on fetal growth and fibroids and was found to not have a heartbeat. Patient reports she was given a prescription for cytotec that was sent to her local pharmacy and she took it that same day. Patient reports the bleeding started that night, but reports it was light until today. Patient reports the bleeding today is much heavier than a normal period with clots that are about the size of a lemon. Patient reports while in MAU she believes she passed the pregnancy.  Passing blood clots? yes Blood soaking clothes? no Lightheaded/dizzy? no Significant pelvic pain or cramping? Yes, pt rates as 9/10, but reports it is getting better but comes in waves, patient reports taking Tylenol 500mg  at 2AM, and Tylenol PM 500mg  around 2AM, patient also took muscle relaxer called tizanidine at 11PM last night Passed any tissue? Yes, appears to have passed pregnancy in MAU Hx ectopic pregnancy? Yes, cornual  Blood Type? O Positive Allergies? Coconut Current medications? Per above, pt denies taking other medications regularly Current [facility] & next appt? Esmond Plants, 01/17/2020  Pt denies vaginal discharge/odor/itching. Pt denies N/V, abdominal pain, constipation, diarrhea, or urinary problems. Pt denies fever, chills, fatigue, sweating or changes in appetite. Pt denies SOB or chest pain. Pt denies dizziness, HA, light-headedness, weakness.   OB History    Gravida  2   Para      Term      Preterm      AB  2   Living        SAB  1   TAB      Ectopic  1   Multiple      Live Births               Past Medical History:  Diagnosis Date  . Anxiety    Previously prescribed ativan but never needed it  . Hyperlipidemia 2103   Monitored by PCP twice yearly  . Hyperlipidemia     Past Surgical History:  Procedure Laterality Date  . wisdom teeth    . XI ROBOT ASSISTED DIAGNOSTIC LAPAROSCOPY Left 05/10/2018   Procedure: DIAGNOSTIC LAPAROSCOPY,OPEN LAPAROTOMY,LEFT OOPHORECTOMY,LEFT SALPINGECTOMY,LEFT ECTOPIC CORNUAL PREGNANCY EXCISION;  Surgeon: Bobbye Charleston, MD;  Location: [facility];  Service: Gynecology;  Laterality: Left;  CORNUAL RESECTION OF ECTOPIC PREGNANCY    Family History  Problem Relation Age of Onset  . Hyperlipidemia Mother   . Hyperlipidemia Father   . Hyperlipidemia Maternal Grandmother   . Hypertension Maternal Grandmother   . Cancer Maternal Grandfather   . Diabetes Maternal Grandfather   . Hypertension Mother   . Heart disease Maternal Grandmother   . Cancer Paternal Grandmother     Social History   Tobacco Use  . Smoking status: Never Smoker  . Smokeless tobacco: Never Used  Substance Use Topics  . Alcohol use: Yes    Comment: occ  . Drug use: No    Allergies:  Allergies  Allergen Reactions  . Coconut Oil Rash  . Coconut Oil Itching and Rash    Medications Prior to Admission  Medication  Sig Dispense Refill Last Dose  . acetaminophen (TYLENOL) 500 MG tablet Take 500 mg by mouth every 6 (six) hours as needed.   01/07/2020 at 0200  . butalbital-acetaminophen-caffeine (ESGIC) 50-325-40 MG per tablet Take 1 tablet by mouth every 6 (six) hours as needed for headache (pain). 20 tablet 0   . cyclobenzaprine (FLEXERIL) 10 MG tablet Take 1 tablet (10 mg total) by mouth 3 (three) times daily as needed for muscle spasms. 30 tablet 0   . ibuprofen (ADVIL,MOTRIN) 800 MG tablet Take 800 mg by mouth every 8 (eight) hours as needed.     Marland Kitchen ibuprofen (ADVIL,MOTRIN) 800 MG tablet Take 1 tablet (800 mg total) by mouth every 8 (eight) hours as needed (mild pain). 30  tablet 0   . meloxicam (MOBIC) 7.5 MG tablet Take 1 tablet (7.5 mg total) by mouth daily. 30 tablet 0   . Multiple Vitamin (MULTIVITAMIN WITH MINERALS) TABS tablet Take 1 tablet by mouth daily.     . Norgestimate-Ethinyl Estradiol Triphasic (TRI-SPRINTEC) 0.18/0.215/0.25 MG-35 MCG tablet Take 1 tablet by mouth daily.     . ondansetron (ZOFRAN) 4 MG tablet Take 1 tablet (4 mg total) by mouth every 6 (six) hours as needed for nausea. 20 tablet 0   . oxyCODONE-acetaminophen (PERCOCET/ROXICET) 5-325 MG tablet Take 1 tablet by mouth every 4 (four) hours as needed for severe pain. 30 tablet 0     Review of Systems  Constitutional: Negative for chills, diaphoresis, fatigue and fever.  Eyes: Negative for visual disturbance.  Respiratory: Negative for shortness of breath.   Cardiovascular: Negative for chest pain.  Gastrointestinal: Negative for abdominal pain, constipation, diarrhea, nausea and vomiting.  Genitourinary: Positive for pelvic pain and vaginal bleeding. Negative for dysuria, flank pain, frequency, urgency and vaginal discharge.  Neurological: Negative for dizziness, weakness, light-headedness and headaches.   Physical Exam   Blood pressure 108/67, pulse (!) 101, temperature 98.1 F (36.7 C), temperature source Oral, resp. rate 18, SpO2 100 %.  Patient Vitals for the past 24 hrs:  BP Temp Temp src Pulse Resp SpO2  01/07/20 0426 108/67 -- -- (!) 101 -- --  01/07/20 0415 106/67 98.1 F (36.7 C) Oral 96 18 100 %   Physical Exam  Constitutional: She is oriented to person, place, and time. She appears well-developed and well-nourished. No distress.  HENT:  Head: Normocephalic and atraumatic.  Respiratory: Effort normal.  GI: Soft.  Genitourinary: There is no rash, tenderness or lesion on the right labia. There is no rash, tenderness or lesion on the left labia. Uterus is not enlarged and not tender. Cervix exhibits no motion tenderness, no discharge and no friability.    Vaginal  bleeding (small amount of bright red blood present in vaginal vault, minimal bright red active bleeding present at external os) present.     No vaginal discharge or tenderness.  There is bleeding (small amount of bright red blood present in vaginal vault, minimal bright red active bleeding present at external os) in the vagina. No tenderness in the vagina.  Neurological: She is alert and oriented to person, place, and time.  Skin: Skin is warm and dry. She is not diaphoretic.  Psychiatric: She has a normal mood and affect. Her behavior is normal. Judgment and thought content normal.    Results for orders placed or performed during the hospital encounter of 01/07/20 (from the past 24 hour(s))  CBC     Status: Abnormal   Collection Time: 01/07/20  5:23 AM  Result  Value Ref Range   WBC 7.7 4.0 - 10.5 K/uL   RBC 3.74 (L) 3.87 - 5.11 MIL/uL   Hemoglobin 10.8 (L) 12.0 - 15.0 g/dL   HCT 34.2 (L) 36.0 - 46.0 %   MCV 91.4 80.0 - 100.0 fL   MCH 28.9 26.0 - 34.0 pg   MCHC 31.6 30.0 - 36.0 g/dL   RDW 13.7 11.5 - 15.5 %   Platelets 229 150 - 400 K/uL   nRBC 0.0 0.0 - 0.2 %  hCG, quantitative, pregnancy     Status: Abnormal   Collection Time: 01/07/20  5:23 AM  Result Value Ref Range   hCG, Beta Chain, Quant, S 8,535 (H) <5 mIU/mL  Urinalysis, Routine w reflex microscopic     Status: Abnormal   Collection Time: 01/07/20  5:41 AM  Result Value Ref Range   Color, Urine YELLOW YELLOW   APPearance HAZY (A) CLEAR   Specific Gravity, Urine 1.032 (H) 1.005 - 1.030   pH 6.0 5.0 - 8.0   Glucose, UA NEGATIVE NEGATIVE mg/dL   Hgb urine dipstick LARGE (A) NEGATIVE   Bilirubin Urine NEGATIVE NEGATIVE   Ketones, ur NEGATIVE NEGATIVE mg/dL   Protein, ur 100 (A) NEGATIVE mg/dL   Nitrite NEGATIVE NEGATIVE   Leukocytes,Ua NEGATIVE NEGATIVE   RBC / HPF >50 (H) 0 - 5 RBC/hpf   WBC, UA 6-10 0 - 5 WBC/hpf   Bacteria, UA RARE (A) NONE SEEN   Squamous Epithelial / LPF 0-5 0 - 5   Mucus PRESENT     US OB  LESS THAN 14 WEEKS WITH OB TRANSVAGINAL  Result Date: 01/07/2020 CLINICAL DATA:  Recent miscarriage with Cytotec administration. Pending quantitative beta HCG. Bleeding. EXAM: OBSTETRIC <14 WK [place] AND TRANSVAGINAL OB US TECHNIQUE: Both transabdominal and transvaginal ultrasound examinations were performed for complete evaluation of the gestation as well as the maternal uterus, adnexal regions, and pelvic cul-de-sac. Transvaginal technique was performed to assess early pregnancy. COMPARISON:  07/06/2017 FINDINGS: No intra or extra uterine gestational sac is seen. The endometrial stripe measures 9 mm without notable heterogeneity or color Doppler flow. Heterogeneous mainly hypoechoic intramural masses consistent with fibroids, the largest anteriorly at the fundus measuring 2.9 cm. Echogenic nonshadowing mass in the right ovary measuring up to 12 x 26 mm. The left ovary is not seen.  No left adnexal mass. Negative for pelvic fluid. IMPRESSION: 1. No visible gestational sac.  The endometrial stripe is 9 mm. 2. Intramural fibroids measuring up to 3 cm. 3. 2.6 cm echogenic right ovarian mass, likely dermoid. 4. Nonvisualized left ovary. Electronically Signed   By: Monte Fantasia M.D.   On: 01/07/2020 06:23    MAU Course  Procedures  MDM -vaginal bleeding and pelvic pain in setting of known miscarriage -pt rates pain 9/10, 600mg  ibuprofen given, after administration, patient reports pain coming in waves still, but she is OK in between, pt does request oxycodone for pain prior to discharge home -UA: hazy/SG1.032/lg hgb/100PRO/rare bacteria, sending urine for culture -CBC: H/H 10.8/34.2 -[place]: no visible gestational sac, 43mm endometrium without notable heterogenicity or color Doppler flow, fibroids, likely dermoid cyst in ovary (patient reports she and OB are aware of fibroids and dermoid) -hCG: 8,535 -ABO: O Positive -pt declines vaginal swabs -minute active bleeding on exam -uterine contents sent to  pathology -patient offered and declines counseling resources -pt discharged to home in stable condition  Orders Placed This Encounter  Procedures  . Culture, OB Urine    Standing Status:  Standing    Number of Occurrences:   1  . US OB LESS THAN 14 WEEKS WITH OB TRANSVAGINAL    Standing Status:   Standing    Number of Occurrences:   1    Order Specific Question:   Symptom/Reason for Exam    Answer:   Miscarriage [id number]  . CBC    Standing Status:   Standing    Number of Occurrences:   1  . hCG, quantitative, pregnancy    Standing Status:   Standing    Number of Occurrences:   1  . Urinalysis, Routine w reflex microscopic    Standing Status:   Standing    Number of Occurrences:   1  . Discharge patient    Order Specific Question:   Discharge disposition    Answer:   01-Home or Self Care [1]    Order Specific Question:   Discharge patient date    Answer:   01/07/2020   Meds ordered this encounter  Medications  . ibuprofen (ADVIL) tablet 600 mg  . oxyCODONE (Oxy IR/ROXICODONE) immediate release tablet 5 mg  . ibuprofen (ADVIL) 600 MG tablet    Sig: Take 1 tablet (600 mg total) by mouth every 6 (six) hours as needed for up to 30 doses for moderate pain or cramping.    Dispense:  30 tablet    Refill:  0    Order Specific Question:   Supervising Provider    AnswerDebbrah Alar [id number]  . oxyCODONE (ROXICODONE) 5 MG immediate release tablet    Sig: Take 1 tablet (5 mg total) by mouth every 8 (eight) hours as needed.    Dispense:  8 tablet    Refill:  0    Order Specific Question:   Supervising Provider    Answer:   Debbrah Alar [id number]    Assessment and Plan   1. Miscarriage   2. Blood type, Rh positive    Allergies as of 01/07/2020      Reactions   Coconut Oil Rash   Coconut Oil Itching, Rash      Medication List    STOP taking these medications   butalbital-acetaminophen-caffeine 50-325-40 MG tablet Commonly known as: Esgic   cyclobenzaprine 10 MG  tablet Commonly known as: FLEXERIL   meloxicam 7.5 MG tablet Commonly known as: MOBIC   ondansetron 4 MG tablet Commonly known as: ZOFRAN   oxyCODONE-acetaminophen 5-325 MG tablet Commonly known as: PERCOCET/ROXICET   Tri-Sprintec 0.18/0.215/0.25 MG-35 MCG tablet Generic drug: Norgestimate-Ethinyl Estradiol Triphasic     TAKE these medications   acetaminophen 500 MG tablet Commonly known as: TYLENOL Take 500 mg by mouth every 6 (six) hours as needed.   ibuprofen 600 MG tablet Commonly known as: ADVIL Take 1 tablet (600 mg total) by mouth every 6 (six) hours as needed for up to 30 doses for moderate pain or cramping. What changed:   medication strength  how much to take  when to take this  reasons to take this  Another medication with the same name was removed. Continue taking this medication, and follow the directions you see here.   multivitamin with minerals Tabs tablet Take 1 tablet by mouth daily.   oxyCODONE 5 MG immediate release tablet Commonly known as: Roxicodone Take 1 tablet (5 mg total) by mouth every 8 (eight) hours as needed.      -will call with culture results, if positive -called and spoke to Dr. Carlis Abbott @ [facility] to recommend  appointment in one week, per Dr. Carlis Abbott, will keep appt as scheduled on 01/17/2020 per Dr. Malachi Carl note -expected s/sx of SAB discussed -warning signes warranting return visit to MAU discussed -pt discharged to home in stable condition  Elmyra Ricks E Theon Sobotka 01/07/2020, 7:18 AM

## 2020-01-07 NOTE — Discharge Instructions (Signed)
Miscarriage A miscarriage is the loss of an unborn baby (fetus) before the 20th week of pregnancy. Most miscarriages happen during the first 3 months of pregnancy. Sometimes, a miscarriage can happen before a woman knows that she is pregnant. Having a miscarriage can be an emotional experience. If you have had a miscarriage, talk with your health care provider about any questions you may have about miscarrying, the grieving process, and your plans for future pregnancy. What are the causes? A miscarriage may be caused by:  Problems with the genes or chromosomes of the fetus. These problems make it impossible for the baby to develop normally. They are often the result of random errors that occur early in the development of the baby, and are not passed from parent to child (not inherited).  Infection of the cervix or uterus.  Conditions that affect hormone balance in the body.  Problems with the cervix, such as the cervix opening and thinning before pregnancy is at term (cervical insufficiency).  Problems with the uterus. These may include: ? A uterus with an abnormal shape. ? Fibroids in the uterus. ? Congenital abnormalities. These are problems that were present at birth.  Certain medical conditions.  Smoking, drinking alcohol, or using drugs.  Injury (trauma). In many cases, the cause of a miscarriage is not known. What are the signs or symptoms? Symptoms of this condition include:  Vaginal bleeding or spotting, with or without cramps or pain.  Pain or cramping in the abdomen or lower back.  Passing fluid, tissue, or blood clots from the vagina. How is this diagnosed? This condition may be diagnosed based on:  A physical exam.  Ultrasound.  Blood tests.  Urine tests. How is this treated? Treatment for a miscarriage is sometimes not necessary if you naturally pass all the tissue that was in your uterus. If necessary, this condition may be treated with:  Dilation and  curettage (D&C). This is a procedure in which the cervix is stretched open and the lining of the uterus (endometrium) is scraped. This is done only if tissue from the fetus or placenta remains in the body (incomplete miscarriage).  Medicines, such as: ? Antibiotic medicine, to treat infection. ? Medicine to help the body pass any remaining tissue. ? Medicine to reduce (contract) the size of the uterus. These medicines may be given if you have a lot of bleeding. If you have Rh negative blood and your baby was Rh positive, you will need a shot of a medicine called Rh immunoglobulinto protect your future babies from Rh blood problems. "Rh-negative" and "Rh-positive" refer to whether or not the blood has a specific protein found on the surface of red blood cells (Rh factor). Follow these instructions at home: Medicines   Take over-the-counter and prescription medicines only as told by your health care provider.  If you were prescribed antibiotic medicine, take it as told by your health care provider. Do not stop taking the antibiotic even if you start to feel better.  Do not take NSAIDs, such as aspirin and ibuprofen, unless they are approved by your health care provider. These medicines can cause bleeding. Activity  Rest as directed. Ask your health care provider what activities are safe for you.  Have someone help with home and family responsibilities during this time. General instructions  Keep track of the number of sanitary pads you use each day and how soaked (saturated) they are. Write down this information.  Monitor the amount of tissue or blood clots that   you pass from your vagina. Save any large amounts of tissue for your health care provider to examine.  Do not use tampons, douche, or have sex until your health care provider approves.  To help you and your partner with the process of grieving, talk with your health care provider or seek counseling.  When you are ready, meet with  your health care provider to discuss any important steps you should take for your health. Also, discuss steps you should take to have a healthy pregnancy in the future.  Keep all follow-up visits as told by your health care provider. This is important. Where to find more information  The American Congress of Obstetricians and Gynecologists: www.acog.org  [place] Department of Health and Human Services Office of Women's Health: www.womenshealth.gov Contact a health care provider if:  You have a fever or chills.  You have a foul smelling vaginal discharge.  You have more bleeding instead of less. Get help right away if:  You have severe cramps or pain in your back or abdomen.  You pass blood clots or tissue from your vagina that is walnut-sized or larger.  You soak more than 1 regular sanitary pad in an hour.  You become light-headed or weak.  You pass out.  You have feelings of sadness that take over your thoughts, or you have thoughts of hurting yourself. Summary  Most miscarriages happen in the first 3 months of pregnancy. Sometimes miscarriage happens before a woman even knows that she is pregnant.  Follow your health care provider's instruction for home care. Keep all follow-up appointments.  To help you and your partner with the process of grieving, talk with your health care provider or seek counseling. This information is not intended to replace advice given to you by your health care provider. Make sure you discuss any questions you have with your health care provider. Document Revised: 12/14/2018 Document Reviewed: 09/27/2016 Elsevier Patient Education  2020 Elsevier Inc.   Managing Pregnancy Loss Pregnancy loss can happen any time during a pregnancy. Often the cause is not known. It is rarely because of anything you did. Pregnancy loss in early pregnancy (during the first trimester) is called a miscarriage. This type of pregnancy loss is the most common. Pregnancy loss  that happens after 20 weeks of pregnancy is called fetal demise if the baby's heart stops beating before birth. Fetal demise is much less common. Some women experience spontaneous labor shortly after fetal demise resulting in a stillborn birth (stillbirth). Any pregnancy loss can be devastating. You will need to recover both physically and emotionally. Most women are able to get pregnant again after a pregnancy loss and deliver a healthy baby. How to manage emotional recovery  Pregnancy loss is very hard emotionally. You may feel many different emotions while you grieve. You may feel sad and angry. You may also feel guilty. It is normal to have periods of crying. Emotional recovery can take longer than physical recovery. It is different for everyone. Taking these steps can help you in managing this loss:  Remember that it is unlikely you did anything to cause the pregnancy loss.  Share your thoughts and feelings with friends, family, and your partner. Remember that your partner is also recovering emotionally.  Make sure you have a good support system. Do not spend too much time alone.  Meet with a pregnancy loss counselor or join a pregnancy loss support group.  Get enough sleep and eat a healthy diet. Return to regular exercise   when you have recovered physically.  Do not use drugs or alcohol to manage your emotions.  Consider seeing a mental health professional to help you recover emotionally.  Ask a friend or loved one to help you decide what to do with any clothing and nursery items you received for your baby. In the case of a stillbirth, many women benefit from taking additional steps in the grieving process. You may want to:  Hold your baby after the birth.  Name your baby.  Request a birth certificate.  Create a keepsake such as handprints or footprints.  Dress your baby and have a picture taken.  Make funeral arrangements.  Ask for a baptism or blessing. Hospitals have  staff members who can help you with all these arrangements. How to recognize emotional stress It is normal to have emotional stress after a pregnancy loss. But emotional stress that lasts a long time or becomes severe requires treatment. Watch out for these signs of severe emotional stress:  Sadness, anger, or guilt that is not going away and is interfering with your normal activities.  Relationship problems that have occurred or gotten worse since the pregnancy loss.  Signs of depression that last longer than 2 weeks. These may include: ? Sadness. ? Anxiety. ? Hopelessness. ? Loss of interest in activities you enjoy. ? Inability to concentrate. ? Trouble sleeping or sleeping too much. ? Loss of appetite or overeating. ? Thoughts of death or of hurting yourself. Follow these instructions at home:  Take over-the-counter and prescription medicines only as told by your health care provider.  Rest at home until your energy level returns. Return to your normal activities as told by your health care provider. Ask your health care provider what activities are safe for you.  When you are ready, meet with your health care provider to discuss steps to take for a future pregnancy.  Keep all follow-up visits as told by your health care provider. This is important. Where to find support  To help you and your partner with the process of grieving, talk with your health care provider or seek counseling.  Consider meeting with others who have experienced pregnancy loss. Ask your health care provider about support groups and resources. Where to find more information  [place].S. Department of Health and Human Services Office on Women's Health: www.womenshealth.gov  American Pregnancy Association: www.americanpregnancy.org Contact a health care provider if:  You continue to experience grief, sadness, or lack of motivation for everyday activities, and those feelings do not improve over time.  You are  struggling to recover emotionally, especially if you are using alcohol or substances to help. Get help right away if:  You have thoughts of hurting yourself or others. If you ever feel like you may hurt yourself or others, or have thoughts about taking your own life, get help right away. You can go to your nearest emergency department or call:  Your local emergency services (911 in the [place]  A suicide crisis helpline, such as the National Suicide Prevention Lifeline at 1-800-273-8255. This is open 24 hours a day. Summary  Any pregnancy loss can be difficult physically and emotionally.  You may experience many different emotions while you grieve. Emotional recovery can last longer than physical recovery.  It is normal to have emotional stress after a pregnancy loss. But emotional stress that lasts a long time or becomes severe requires treatment.  See your health care provider if you are struggling emotionally after a pregnancy loss. This information   is not intended to replace advice given to you by your health care provider. Make sure you discuss any questions you have with your health care provider. Document Revised: 12/12/2018 Document Reviewed: 11/02/2017 Elsevier Patient Education  2020 Elsevier Inc.  

## 2020-01-07 NOTE — MAU Note (Addendum)
Pt reports to MAU for miscarriage, pt reports she went to her office on Friday 01-03-20 and was given medication for the products. Pt reports bleeding since taking the medication and reports increased pain and clotting since yesterday. Pt also reports taking tylenol around 2am, and a muscle relaxer lastnight.

## 2020-01-08 LAB — CULTURE, OB URINE

## 2020-01-08 LAB — SURGICAL PATHOLOGY

## 2020-01-09 ENCOUNTER — Other Ambulatory Visit: Payer: Self-pay | Admitting: Women's Health

## 2020-03-10 MED FILL — HYDROXYZINE PAMOATE 25 MG C: 25 | 5 days supply | Qty: 30 | Fill #3

## 2020-03-10 MED FILL — METFORMIN HCL 1000 MG TABS: 1000 | 30 days supply | Qty: 60 | Fill #2

## 2020-04-07 DIAGNOSIS — Z01419 Encounter for gynecological examination (general) (routine) without abnormal findings: Secondary | ICD-10-CM | POA: Diagnosis not present

## 2020-04-17 ENCOUNTER — Other Ambulatory Visit (HOSPITAL_COMMUNITY): Payer: Self-pay | Admitting: Physician Assistant

## 2020-04-17 DIAGNOSIS — D539 Nutritional anemia, unspecified: Secondary | ICD-10-CM | POA: Diagnosis not present

## 2020-04-17 DIAGNOSIS — Z79899 Other long term (current) drug therapy: Secondary | ICD-10-CM | POA: Diagnosis not present

## 2020-04-17 DIAGNOSIS — R5383 Other fatigue: Secondary | ICD-10-CM | POA: Diagnosis not present

## 2020-04-17 DIAGNOSIS — E7800 Pure hypercholesterolemia, unspecified: Secondary | ICD-10-CM | POA: Diagnosis not present

## 2020-04-17 DIAGNOSIS — Z1159 Encounter for screening for other viral diseases: Secondary | ICD-10-CM | POA: Diagnosis not present

## 2020-04-17 DIAGNOSIS — E559 Vitamin D deficiency, unspecified: Secondary | ICD-10-CM | POA: Diagnosis not present

## 2020-04-17 MED FILL — IBUPROFEN 800 MG TAB: 800 | 30 days supply | Qty: 90 | Fill #0

## 2020-04-17 MED FILL — HYDROXYZINE PAMOATE 25 MG C: 25 | 5 days supply | Qty: 30 | Fill #0

## 2020-04-20 MED FILL — METFORMIN HCL 1000 MG TABS: 1000 | 30 days supply | Qty: 60 | Fill #3

## 2020-05-24 MED FILL — METFORMIN HCL 1000 MG TABS: 1000 | 30 days supply | Qty: 60 | Fill #4

## 2020-07-04 MED FILL — METFORMIN HCL 1000 MG TABS: 1000 | 30 days supply | Qty: 60 | Fill #5

## 2020-08-02 MED FILL — METFORMIN HCL 1000 MG TABS: 1000 | 30 days supply | Qty: 60 | Fill #6

## 2020-09-08 MED FILL — METFORMIN HCL 1000 MG TABS: 1000 | 30 days supply | Qty: 60 | Fill #7

## 2020-09-08 MED FILL — HYDROXYZINE PAMOATE 25 MG C: 25 | 5 days supply | Qty: 30 | Fill #1

## 2020-09-24 DIAGNOSIS — H5213 Myopia, bilateral: Secondary | ICD-10-CM | POA: Diagnosis not present

## 2020-09-29 ENCOUNTER — Other Ambulatory Visit (HOSPITAL_COMMUNITY): Payer: Self-pay | Admitting: Obstetrics and Gynecology

## 2020-09-29 DIAGNOSIS — E288 Other ovarian dysfunction: Secondary | ICD-10-CM | POA: Diagnosis not present

## 2020-09-29 DIAGNOSIS — Z319 Encounter for procreative management, unspecified: Secondary | ICD-10-CM | POA: Diagnosis not present

## 2020-09-29 MED FILL — NORETHINDRONE 5 MG TABLET: 5 | 30 days supply | Qty: 30 | Fill #0

## 2020-10-13 DIAGNOSIS — Z3183 Encounter for assisted reproductive fertility procedure cycle: Secondary | ICD-10-CM | POA: Diagnosis not present

## 2020-10-13 DIAGNOSIS — Z319 Encounter for procreative management, unspecified: Secondary | ICD-10-CM | POA: Diagnosis not present

## 2020-10-13 DIAGNOSIS — N83201 Unspecified ovarian cyst, right side: Secondary | ICD-10-CM | POA: Diagnosis not present

## 2020-10-13 DIAGNOSIS — E288 Other ovarian dysfunction: Secondary | ICD-10-CM | POA: Diagnosis not present

## 2020-10-14 ENCOUNTER — Other Ambulatory Visit (HOSPITAL_COMMUNITY): Payer: Self-pay | Admitting: Obstetrics and Gynecology

## 2020-10-14 MED FILL — DOXYCYCLINE HYCLATE 100 MG: 100 | 5 days supply | Qty: 6 | Fill #0

## 2020-10-15 MED FILL — METFORMIN HCL 1000 MG TABS: 1000 | 30 days supply | Qty: 60 | Fill #8

## 2020-10-22 MED FILL — IBUPROFEN 800 MG TAB: 800 | 30 days supply | Qty: 90 | Fill #1

## 2020-10-26 ENCOUNTER — Other Ambulatory Visit (HOSPITAL_COMMUNITY): Payer: Self-pay | Admitting: Obstetrics and Gynecology

## 2020-10-26 DIAGNOSIS — N83291 Other ovarian cyst, right side: Secondary | ICD-10-CM | POA: Diagnosis not present

## 2020-10-26 MED FILL — LETROZOLE 2.5 MG TABLET: 2.5 | 5 days supply | Qty: 15 | Fill #0

## 2020-10-27 DIAGNOSIS — D539 Nutritional anemia, unspecified: Secondary | ICD-10-CM | POA: Diagnosis not present

## 2020-10-27 DIAGNOSIS — R0602 Shortness of breath: Secondary | ICD-10-CM | POA: Diagnosis not present

## 2020-10-27 DIAGNOSIS — E559 Vitamin D deficiency, unspecified: Secondary | ICD-10-CM | POA: Diagnosis not present

## 2020-10-27 DIAGNOSIS — Z13220 Encounter for screening for lipoid disorders: Secondary | ICD-10-CM | POA: Diagnosis not present

## 2020-10-27 DIAGNOSIS — R896 Abnormal cytological findings in specimens from other organs, systems and tissues: Secondary | ICD-10-CM | POA: Diagnosis not present

## 2020-10-27 DIAGNOSIS — N83201 Unspecified ovarian cyst, right side: Secondary | ICD-10-CM | POA: Diagnosis not present

## 2020-10-27 DIAGNOSIS — E538 Deficiency of other specified B group vitamins: Secondary | ICD-10-CM | POA: Diagnosis not present

## 2020-10-27 DIAGNOSIS — Z0000 Encounter for general adult medical examination without abnormal findings: Secondary | ICD-10-CM | POA: Diagnosis not present

## 2020-10-27 DIAGNOSIS — Z131 Encounter for screening for diabetes mellitus: Secondary | ICD-10-CM | POA: Diagnosis not present

## 2020-10-27 DIAGNOSIS — R5383 Other fatigue: Secondary | ICD-10-CM | POA: Diagnosis not present

## 2020-10-27 DIAGNOSIS — Z114 Encounter for screening for human immunodeficiency virus [HIV]: Secondary | ICD-10-CM | POA: Diagnosis not present

## 2020-11-09 ENCOUNTER — Other Ambulatory Visit (HOSPITAL_COMMUNITY): Payer: Self-pay | Admitting: Obstetrics and Gynecology

## 2020-11-09 DIAGNOSIS — Z3189 Encounter for other procreative management: Secondary | ICD-10-CM | POA: Diagnosis not present

## 2020-11-09 MED FILL — OVIDREL 250 MCG/0.5 ML SYRG: 250 | 30 days supply | Qty: 1 | Fill #0

## 2020-11-12 ENCOUNTER — Other Ambulatory Visit (HOSPITAL_COMMUNITY): Payer: Self-pay | Admitting: Reproductive Endocrinology and Infertility

## 2020-11-12 DIAGNOSIS — Z3189 Encounter for other procreative management: Secondary | ICD-10-CM | POA: Diagnosis not present

## 2020-11-12 MED FILL — PROGESTERONE 200 MG CAPS: 200 | 15 days supply | Qty: 30 | Fill #0

## 2020-12-08 ENCOUNTER — Other Ambulatory Visit (HOSPITAL_COMMUNITY): Payer: Self-pay

## 2020-12-08 MED ORDER — METHYLDOPA 250 MG PO TABS: 250.0000 mg | ORAL_TABLET | Freq: Two times a day (BID) | ORAL | 0 refills | Status: DC

## 2020-12-09 ENCOUNTER — Other Ambulatory Visit (HOSPITAL_COMMUNITY): Payer: Self-pay

## 2020-12-09 DIAGNOSIS — Z3189 Encounter for other procreative management: Secondary | ICD-10-CM | POA: Diagnosis not present

## 2020-12-11 ENCOUNTER — Other Ambulatory Visit (HOSPITAL_COMMUNITY): Payer: Self-pay

## 2020-12-11 MED FILL — Choriogonadotropin Alfa Inj 250 MCG/0.5ML: SUBCUTANEOUS | 30 days supply | Qty: 0.5 | Fill #0 | Status: CN

## 2020-12-11 MED FILL — Progesterone Cap 200 MG: ORAL | 15 days supply | Qty: 30 | Fill #0 | Status: AC

## 2020-12-12 ENCOUNTER — Other Ambulatory Visit (HOSPITAL_COMMUNITY): Payer: Self-pay

## 2020-12-14 DIAGNOSIS — Z3189 Encounter for other procreative management: Secondary | ICD-10-CM | POA: Diagnosis not present

## 2020-12-15 ENCOUNTER — Other Ambulatory Visit (HOSPITAL_COMMUNITY): Payer: Self-pay

## 2020-12-15 MED ORDER — NIFEDIPINE ER OSMOTIC RELEASE 30 MG PO TB24
30.0000 mg | ORAL_TABLET | Freq: Every day | ORAL | 1 refills | Status: DC
  Filled 2020-12-15: qty 30, 30d supply, fill #0
  Filled 2021-01-10: qty 30, 30d supply, fill #1

## 2020-12-17 ENCOUNTER — Other Ambulatory Visit (HOSPITAL_COMMUNITY): Payer: Self-pay

## 2020-12-31 ENCOUNTER — Other Ambulatory Visit (HOSPITAL_COMMUNITY): Payer: Self-pay

## 2020-12-31 MED FILL — Letrozole Tab 2.5 MG: ORAL | 5 days supply | Qty: 15 | Fill #0 | Status: AC

## 2021-01-11 ENCOUNTER — Other Ambulatory Visit (HOSPITAL_COMMUNITY): Payer: Self-pay

## 2021-01-11 DIAGNOSIS — Z3189 Encounter for other procreative management: Secondary | ICD-10-CM | POA: Diagnosis not present

## 2021-01-11 MED ORDER — CHORIOGONADOTROPIN ALFA 250 MCG/0.5ML ~~LOC~~ INJ
INJECTION | SUBCUTANEOUS | 2 refills | Status: DC
Start: 2021-01-11 — End: 2022-08-23
  Filled 2021-01-11: qty 0.5, 1d supply, fill #0

## 2021-01-11 MED ORDER — METFORMIN HCL 1000 MG PO TABS
1000.0000 mg | ORAL_TABLET | Freq: Two times a day (BID) | ORAL | 11 refills | Status: DC
  Filled 2021-01-11: qty 60, 30d supply, fill #0
  Filled 2021-03-09: qty 60, 30d supply, fill #1
  Filled 2021-06-01: qty 60, 30d supply, fill #2
  Filled 2021-08-05: qty 60, 30d supply, fill #3
  Filled 2021-09-12: qty 60, 30d supply, fill #4
  Filled 2021-11-14: qty 60, 30d supply, fill #5
  Filled 2021-12-21: qty 60, 30d supply, fill #6

## 2021-01-12 ENCOUNTER — Other Ambulatory Visit (HOSPITAL_COMMUNITY): Payer: Self-pay

## 2021-01-12 MED FILL — Progesterone Cap 200 MG: ORAL | 15 days supply | Qty: 30 | Fill #1 | Status: AC

## 2021-01-13 ENCOUNTER — Other Ambulatory Visit (HOSPITAL_COMMUNITY): Payer: Self-pay

## 2021-01-13 DIAGNOSIS — Z3189 Encounter for other procreative management: Secondary | ICD-10-CM | POA: Diagnosis not present

## 2021-01-14 ENCOUNTER — Other Ambulatory Visit (HOSPITAL_COMMUNITY): Payer: Self-pay

## 2021-01-15 ENCOUNTER — Other Ambulatory Visit (HOSPITAL_COMMUNITY): Payer: Self-pay

## 2021-01-27 ENCOUNTER — Other Ambulatory Visit (HOSPITAL_COMMUNITY): Payer: Self-pay

## 2021-01-27 DIAGNOSIS — Z79899 Other long term (current) drug therapy: Secondary | ICD-10-CM | POA: Diagnosis not present

## 2021-01-27 DIAGNOSIS — E7800 Pure hypercholesterolemia, unspecified: Secondary | ICD-10-CM | POA: Diagnosis not present

## 2021-01-27 DIAGNOSIS — E559 Vitamin D deficiency, unspecified: Secondary | ICD-10-CM | POA: Diagnosis not present

## 2021-01-27 DIAGNOSIS — D539 Nutritional anemia, unspecified: Secondary | ICD-10-CM | POA: Diagnosis not present

## 2021-01-27 DIAGNOSIS — E538 Deficiency of other specified B group vitamins: Secondary | ICD-10-CM | POA: Diagnosis not present

## 2021-01-27 MED ORDER — NIFEDIPINE ER OSMOTIC RELEASE 30 MG PO TB24
30.0000 mg | ORAL_TABLET | Freq: Every day | ORAL | 6 refills | Status: DC
  Filled 2021-01-27 – 2021-02-08 (×2): qty 30, 30d supply, fill #0
  Filled 2021-03-09: qty 30, 30d supply, fill #1
  Filled 2021-04-13: qty 30, 30d supply, fill #2
  Filled 2021-05-14: qty 30, 30d supply, fill #3
  Filled 2021-06-01 – 2021-06-10 (×2): qty 30, 30d supply, fill #4
  Filled 2021-07-15: qty 30, 30d supply, fill #5
  Filled 2021-08-12: qty 30, 30d supply, fill #6
  Filled ????-??-??: fill #4

## 2021-02-08 ENCOUNTER — Other Ambulatory Visit (HOSPITAL_COMMUNITY): Payer: Self-pay

## 2021-03-09 ENCOUNTER — Other Ambulatory Visit (HOSPITAL_COMMUNITY): Payer: Self-pay

## 2021-04-13 MED FILL — Hydroxyzine Pamoate Cap 25 MG: ORAL | 5 days supply | Qty: 30 | Fill #0 | Status: AC

## 2021-04-14 ENCOUNTER — Other Ambulatory Visit (HOSPITAL_COMMUNITY): Payer: Self-pay

## 2021-04-20 DIAGNOSIS — Z01419 Encounter for gynecological examination (general) (routine) without abnormal findings: Secondary | ICD-10-CM | POA: Diagnosis not present

## 2021-05-14 ENCOUNTER — Other Ambulatory Visit (HOSPITAL_COMMUNITY): Payer: Self-pay

## 2021-05-26 DIAGNOSIS — E538 Deficiency of other specified B group vitamins: Secondary | ICD-10-CM | POA: Diagnosis not present

## 2021-05-26 DIAGNOSIS — E7800 Pure hypercholesterolemia, unspecified: Secondary | ICD-10-CM | POA: Diagnosis not present

## 2021-05-26 DIAGNOSIS — Z79899 Other long term (current) drug therapy: Secondary | ICD-10-CM | POA: Diagnosis not present

## 2021-05-26 DIAGNOSIS — E559 Vitamin D deficiency, unspecified: Secondary | ICD-10-CM | POA: Diagnosis not present

## 2021-05-26 DIAGNOSIS — Z1159 Encounter for screening for other viral diseases: Secondary | ICD-10-CM | POA: Diagnosis not present

## 2021-05-26 DIAGNOSIS — R5383 Other fatigue: Secondary | ICD-10-CM | POA: Diagnosis not present

## 2021-05-26 DIAGNOSIS — D539 Nutritional anemia, unspecified: Secondary | ICD-10-CM | POA: Diagnosis not present

## 2021-06-02 ENCOUNTER — Other Ambulatory Visit (HOSPITAL_COMMUNITY): Payer: Self-pay

## 2021-06-03 ENCOUNTER — Other Ambulatory Visit (HOSPITAL_COMMUNITY): Payer: Self-pay

## 2021-06-10 ENCOUNTER — Other Ambulatory Visit (HOSPITAL_COMMUNITY): Payer: Self-pay

## 2021-06-11 ENCOUNTER — Other Ambulatory Visit (HOSPITAL_COMMUNITY): Payer: Self-pay

## 2021-06-12 ENCOUNTER — Other Ambulatory Visit (HOSPITAL_COMMUNITY): Payer: Self-pay

## 2021-07-15 ENCOUNTER — Other Ambulatory Visit (HOSPITAL_COMMUNITY): Payer: Self-pay

## 2021-07-16 ENCOUNTER — Other Ambulatory Visit (HOSPITAL_COMMUNITY): Payer: Self-pay

## 2021-07-16 MED ORDER — IBUPROFEN 800 MG PO TABS
800.0000 mg | ORAL_TABLET | Freq: Three times a day (TID) | ORAL | 3 refills | Status: DC | PRN
  Filled 2021-07-16: qty 90, 30d supply, fill #0
  Filled 2021-10-25: qty 90, 30d supply, fill #1

## 2021-08-05 ENCOUNTER — Other Ambulatory Visit (HOSPITAL_COMMUNITY): Payer: Self-pay

## 2021-08-12 ENCOUNTER — Other Ambulatory Visit (HOSPITAL_COMMUNITY): Payer: Self-pay

## 2021-09-12 ENCOUNTER — Other Ambulatory Visit (HOSPITAL_COMMUNITY): Payer: Self-pay

## 2021-09-13 ENCOUNTER — Other Ambulatory Visit (HOSPITAL_COMMUNITY): Payer: Self-pay

## 2021-09-15 ENCOUNTER — Other Ambulatory Visit (HOSPITAL_COMMUNITY): Payer: Self-pay

## 2021-09-15 MED ORDER — NIFEDIPINE ER OSMOTIC RELEASE 30 MG PO TB24
30.0000 mg | ORAL_TABLET | Freq: Every day | ORAL | 6 refills | Status: DC
  Filled 2021-09-15: qty 30, 30d supply, fill #0
  Filled 2021-10-15: qty 30, 30d supply, fill #1
  Filled 2021-11-14: qty 30, 30d supply, fill #2
  Filled 2021-12-21: qty 30, 30d supply, fill #3
  Filled 2022-01-17: qty 30, 30d supply, fill #4
  Filled 2022-02-18: qty 30, 30d supply, fill #5
  Filled 2022-03-17: qty 30, 30d supply, fill #6

## 2021-09-16 ENCOUNTER — Other Ambulatory Visit (HOSPITAL_COMMUNITY): Payer: Self-pay

## 2021-10-15 ENCOUNTER — Other Ambulatory Visit (HOSPITAL_COMMUNITY): Payer: Self-pay

## 2021-10-26 ENCOUNTER — Other Ambulatory Visit (HOSPITAL_COMMUNITY): Payer: Self-pay

## 2021-10-26 DIAGNOSIS — H5213 Myopia, bilateral: Secondary | ICD-10-CM | POA: Diagnosis not present

## 2021-10-27 ENCOUNTER — Other Ambulatory Visit (HOSPITAL_COMMUNITY): Payer: Self-pay

## 2021-10-27 DIAGNOSIS — Z3183 Encounter for assisted reproductive fertility procedure cycle: Secondary | ICD-10-CM | POA: Diagnosis not present

## 2021-10-27 DIAGNOSIS — N8500 Endometrial hyperplasia, unspecified: Secondary | ICD-10-CM | POA: Diagnosis not present

## 2021-10-27 DIAGNOSIS — Z113 Encounter for screening for infections with a predominantly sexual mode of transmission: Secondary | ICD-10-CM | POA: Diagnosis not present

## 2021-10-27 DIAGNOSIS — Z31430 Encounter of female for testing for genetic disease carrier status for procreative management: Secondary | ICD-10-CM | POA: Diagnosis not present

## 2021-10-27 DIAGNOSIS — E288 Other ovarian dysfunction: Secondary | ICD-10-CM | POA: Diagnosis not present

## 2021-10-27 DIAGNOSIS — Z319 Encounter for procreative management, unspecified: Secondary | ICD-10-CM | POA: Diagnosis not present

## 2021-10-27 DIAGNOSIS — E2689 Other hyperaldosteronism: Secondary | ICD-10-CM | POA: Diagnosis not present

## 2021-10-27 MED ORDER — "EASY TOUCH HYPODERMIC NEEDLE 22G X 1-1/2"" MISC"
2 refills | Status: DC
  Filled 2021-10-27: qty 30, 15d supply, fill #0
  Filled 2021-11-12: qty 30, 30d supply, fill #0
  Filled 2022-03-16: qty 30, 30d supply, fill #1
  Filled 2022-05-21: qty 30, 30d supply, fill #2

## 2021-10-27 MED ORDER — "BD LUER-LOK SYRINGE 18G X 1-1/2"" 3 ML MISC"
2 refills | Status: DC
  Filled 2021-10-27 – 2021-11-12 (×2): qty 60, 30d supply, fill #0
  Filled 2022-03-16: qty 60, 30d supply, fill #1

## 2021-10-27 MED ORDER — PROGESTERONE 50 MG/ML IM OIL
TOPICAL_OIL | INTRAMUSCULAR | 2 refills | Status: DC
  Filled 2021-10-27 – 2021-11-12 (×2): qty 30, 30d supply, fill #0
  Filled 2022-04-12: qty 30, 30d supply, fill #1
  Filled 2022-05-21: qty 30, 30d supply, fill #2

## 2021-10-27 MED ORDER — METHYLPREDNISOLONE 8 MG PO TABS
8.0000 mg | ORAL_TABLET | Freq: Two times a day (BID) | ORAL | 0 refills | Status: DC
  Filled 2021-10-27 – 2021-11-12 (×2): qty 8, 4d supply, fill #0

## 2021-10-27 MED ORDER — DOXYCYCLINE HYCLATE 100 MG PO TABS
100.0000 mg | ORAL_TABLET | Freq: Two times a day (BID) | ORAL | 0 refills | Status: DC
  Filled 2021-10-27 – 2021-11-12 (×2): qty 40, 20d supply, fill #0

## 2021-10-27 MED ORDER — "EASY TOUCH HYPODERMIC NEEDLE 30G X 1/2"" MISC"
2 refills | Status: DC
  Filled 2021-10-27: qty 30, 15d supply, fill #0
  Filled 2021-11-12: qty 30, 30d supply, fill #0

## 2021-10-27 MED ORDER — GONAL-F RFF REDIJECT 900 UNIT/1.5ML ~~LOC~~ SOPN
PEN_INJECTOR | SUBCUTANEOUS | 1 refills | Status: DC
  Filled 2021-10-27 – 2021-11-12 (×2): qty 4.5, 9d supply, fill #0
  Filled 2021-11-24: qty 4.5, 9d supply, fill #1

## 2021-10-27 MED ORDER — ESTRADIOL 0.1 MG/24HR TD PTTW
MEDICATED_PATCH | TRANSDERMAL | 2 refills | Status: DC
  Filled 2021-10-27: qty 8, 28d supply, fill #0
  Filled 2022-02-25: qty 8, 28d supply, fill #1
  Filled 2022-03-30: qty 8, 28d supply, fill #2

## 2021-10-27 MED ORDER — DOXYCYCLINE HYCLATE 100 MG PO CAPS
ORAL_CAPSULE | ORAL | 0 refills | Status: DC
  Filled 2021-10-27: qty 10, 5d supply, fill #0

## 2021-10-27 MED ORDER — NOVAREL 5000 UNITS IM SOLR
INTRAMUSCULAR | 0 refills | Status: DC
  Filled 2021-10-27: qty 2, 1d supply, fill #0
  Filled 2021-11-12: qty 2, 2d supply, fill #0

## 2021-10-27 MED ORDER — MENOPUR 75 UNITS ~~LOC~~ SOLR
SUBCUTANEOUS | 1 refills | Status: DC
  Filled 2021-10-27 – 2021-11-12 (×2): qty 10, 10d supply, fill #0

## 2021-10-27 MED ORDER — CETRORELIX ACETATE 0.25 MG ~~LOC~~ KIT
PACK | SUBCUTANEOUS | 1 refills | Status: DC
  Filled 2021-10-27 – 2021-11-12 (×2): qty 5, 5d supply, fill #0

## 2021-10-27 MED ORDER — ESTRADIOL 2 MG PO TABS
2.0000 mg | ORAL_TABLET | Freq: Two times a day (BID) | ORAL | 2 refills | Status: DC
  Filled 2021-10-27 – 2021-11-12 (×2): qty 60, 30d supply, fill #0
  Filled 2022-03-16: qty 60, 30d supply, fill #1
  Filled 2022-04-20: qty 60, 30d supply, fill #2

## 2021-10-29 ENCOUNTER — Other Ambulatory Visit (HOSPITAL_COMMUNITY): Payer: Self-pay

## 2021-11-12 ENCOUNTER — Other Ambulatory Visit (HOSPITAL_COMMUNITY): Payer: Self-pay

## 2021-11-14 ENCOUNTER — Other Ambulatory Visit (HOSPITAL_COMMUNITY): Payer: Self-pay

## 2021-11-15 ENCOUNTER — Other Ambulatory Visit (HOSPITAL_COMMUNITY): Payer: Self-pay

## 2021-11-17 DIAGNOSIS — Z319 Encounter for procreative management, unspecified: Secondary | ICD-10-CM | POA: Diagnosis not present

## 2021-11-17 DIAGNOSIS — N978 Female infertility of other origin: Secondary | ICD-10-CM | POA: Diagnosis not present

## 2021-11-17 DIAGNOSIS — E288 Other ovarian dysfunction: Secondary | ICD-10-CM | POA: Diagnosis not present

## 2021-11-17 DIAGNOSIS — N971 Female infertility of tubal origin: Secondary | ICD-10-CM | POA: Diagnosis not present

## 2021-11-17 DIAGNOSIS — Z3183 Encounter for assisted reproductive fertility procedure cycle: Secondary | ICD-10-CM | POA: Diagnosis not present

## 2021-11-20 DIAGNOSIS — Z3183 Encounter for assisted reproductive fertility procedure cycle: Secondary | ICD-10-CM | POA: Diagnosis not present

## 2021-11-20 DIAGNOSIS — N971 Female infertility of tubal origin: Secondary | ICD-10-CM | POA: Diagnosis not present

## 2021-11-20 DIAGNOSIS — Z319 Encounter for procreative management, unspecified: Secondary | ICD-10-CM | POA: Diagnosis not present

## 2021-11-20 DIAGNOSIS — N978 Female infertility of other origin: Secondary | ICD-10-CM | POA: Diagnosis not present

## 2021-11-20 DIAGNOSIS — E288 Other ovarian dysfunction: Secondary | ICD-10-CM | POA: Diagnosis not present

## 2021-11-22 ENCOUNTER — Other Ambulatory Visit (HOSPITAL_COMMUNITY): Payer: Self-pay

## 2021-11-22 DIAGNOSIS — Z3183 Encounter for assisted reproductive fertility procedure cycle: Secondary | ICD-10-CM | POA: Diagnosis not present

## 2021-11-24 DIAGNOSIS — E288 Other ovarian dysfunction: Secondary | ICD-10-CM | POA: Diagnosis not present

## 2021-11-24 DIAGNOSIS — Z3183 Encounter for assisted reproductive fertility procedure cycle: Secondary | ICD-10-CM | POA: Diagnosis not present

## 2021-11-24 DIAGNOSIS — N978 Female infertility of other origin: Secondary | ICD-10-CM | POA: Diagnosis not present

## 2021-11-24 DIAGNOSIS — N971 Female infertility of tubal origin: Secondary | ICD-10-CM | POA: Diagnosis not present

## 2021-11-24 DIAGNOSIS — Z319 Encounter for procreative management, unspecified: Secondary | ICD-10-CM | POA: Diagnosis not present

## 2021-11-25 ENCOUNTER — Other Ambulatory Visit (HOSPITAL_COMMUNITY): Payer: Self-pay

## 2021-11-26 ENCOUNTER — Other Ambulatory Visit (HOSPITAL_COMMUNITY): Payer: Self-pay

## 2021-11-26 DIAGNOSIS — Z3183 Encounter for assisted reproductive fertility procedure cycle: Secondary | ICD-10-CM | POA: Diagnosis not present

## 2021-11-26 DIAGNOSIS — E288 Other ovarian dysfunction: Secondary | ICD-10-CM | POA: Diagnosis not present

## 2021-11-26 DIAGNOSIS — N971 Female infertility of tubal origin: Secondary | ICD-10-CM | POA: Diagnosis not present

## 2021-11-26 DIAGNOSIS — Z319 Encounter for procreative management, unspecified: Secondary | ICD-10-CM | POA: Diagnosis not present

## 2021-11-26 DIAGNOSIS — N978 Female infertility of other origin: Secondary | ICD-10-CM | POA: Diagnosis not present

## 2021-11-26 MED ORDER — CABERGOLINE 0.5 MG PO TABS
0.5000 mg | ORAL_TABLET | Freq: Every day | ORAL | 0 refills | Status: DC
  Filled 2021-11-26: qty 8, 8d supply, fill #0

## 2021-11-26 MED ORDER — TRAMADOL HCL 50 MG PO TABS
50.0000 mg | ORAL_TABLET | Freq: Four times a day (QID) | ORAL | 0 refills | Status: DC | PRN
  Filled 2021-11-26: qty 10, 3d supply, fill #0

## 2021-11-26 MED ORDER — PROMETHAZINE HCL 12.5 MG PO TABS
12.5000 mg | ORAL_TABLET | Freq: Four times a day (QID) | ORAL | 0 refills | Status: DC | PRN
  Filled 2021-11-26: qty 10, 3d supply, fill #0

## 2021-11-27 ENCOUNTER — Other Ambulatory Visit (HOSPITAL_COMMUNITY): Payer: Self-pay

## 2021-11-29 DIAGNOSIS — Z3183 Encounter for assisted reproductive fertility procedure cycle: Secondary | ICD-10-CM | POA: Diagnosis not present

## 2021-11-30 ENCOUNTER — Other Ambulatory Visit (HOSPITAL_COMMUNITY): Payer: Self-pay

## 2021-11-30 MED ORDER — NORETHINDRONE ACETATE 5 MG PO TABS
ORAL_TABLET | ORAL | 3 refills | Status: DC
  Filled 2021-11-30: qty 30, 30d supply, fill #0

## 2021-12-01 ENCOUNTER — Other Ambulatory Visit (HOSPITAL_COMMUNITY): Payer: Self-pay

## 2021-12-01 MED ORDER — NORETHINDRONE ACETATE 5 MG PO TABS
ORAL_TABLET | ORAL | 3 refills | Status: DC
  Filled 2021-12-01: qty 30, 30d supply, fill #0

## 2021-12-04 DIAGNOSIS — Z3183 Encounter for assisted reproductive fertility procedure cycle: Secondary | ICD-10-CM | POA: Diagnosis not present

## 2021-12-09 ENCOUNTER — Other Ambulatory Visit (HOSPITAL_COMMUNITY): Payer: Self-pay

## 2021-12-14 DIAGNOSIS — R0602 Shortness of breath: Secondary | ICD-10-CM | POA: Diagnosis not present

## 2021-12-14 DIAGNOSIS — Z79899 Other long term (current) drug therapy: Secondary | ICD-10-CM | POA: Diagnosis not present

## 2021-12-14 DIAGNOSIS — R300 Dysuria: Secondary | ICD-10-CM | POA: Diagnosis not present

## 2021-12-14 DIAGNOSIS — E559 Vitamin D deficiency, unspecified: Secondary | ICD-10-CM | POA: Diagnosis not present

## 2021-12-14 DIAGNOSIS — E7800 Pure hypercholesterolemia, unspecified: Secondary | ICD-10-CM | POA: Diagnosis not present

## 2021-12-14 DIAGNOSIS — E538 Deficiency of other specified B group vitamins: Secondary | ICD-10-CM | POA: Diagnosis not present

## 2021-12-14 DIAGNOSIS — R5383 Other fatigue: Secondary | ICD-10-CM | POA: Diagnosis not present

## 2021-12-17 DIAGNOSIS — Z3202 Encounter for pregnancy test, result negative: Secondary | ICD-10-CM | POA: Diagnosis not present

## 2021-12-17 DIAGNOSIS — Z3141 Encounter for fertility testing: Secondary | ICD-10-CM | POA: Diagnosis not present

## 2021-12-21 ENCOUNTER — Other Ambulatory Visit (HOSPITAL_COMMUNITY): Payer: Self-pay

## 2021-12-28 ENCOUNTER — Other Ambulatory Visit: Payer: Self-pay | Admitting: Physician Assistant

## 2021-12-28 DIAGNOSIS — I4729 Other ventricular tachycardia: Secondary | ICD-10-CM | POA: Diagnosis not present

## 2021-12-28 DIAGNOSIS — E039 Hypothyroidism, unspecified: Secondary | ICD-10-CM | POA: Diagnosis not present

## 2021-12-28 DIAGNOSIS — Z1231 Encounter for screening mammogram for malignant neoplasm of breast: Secondary | ICD-10-CM

## 2021-12-28 DIAGNOSIS — Z6830 Body mass index (BMI) 30.0-30.9, adult: Secondary | ICD-10-CM | POA: Diagnosis not present

## 2021-12-28 DIAGNOSIS — I10 Essential (primary) hypertension: Secondary | ICD-10-CM | POA: Diagnosis not present

## 2021-12-28 DIAGNOSIS — R000 Tachycardia, unspecified: Secondary | ICD-10-CM | POA: Diagnosis not present

## 2022-01-03 DIAGNOSIS — R000 Tachycardia, unspecified: Secondary | ICD-10-CM | POA: Diagnosis not present

## 2022-01-05 ENCOUNTER — Ambulatory Visit
Admission: RE | Admit: 2022-01-05 | Discharge: 2022-01-05 | Disposition: A | Discharge status: Home / Self Care | Payer: 59 | Source: Ambulatory Visit | Attending: Physician Assistant | Admitting: Physician Assistant

## 2022-01-05 DIAGNOSIS — Z1231 Encounter for screening mammogram for malignant neoplasm of breast: Secondary | ICD-10-CM | POA: Diagnosis not present

## 2022-01-07 ENCOUNTER — Other Ambulatory Visit: Payer: Self-pay | Admitting: Physician Assistant

## 2022-01-07 DIAGNOSIS — R928 Other abnormal and inconclusive findings on diagnostic imaging of breast: Secondary | ICD-10-CM

## 2022-01-07 DIAGNOSIS — R000 Tachycardia, unspecified: Secondary | ICD-10-CM | POA: Diagnosis not present

## 2022-01-07 DIAGNOSIS — I10 Essential (primary) hypertension: Secondary | ICD-10-CM | POA: Diagnosis not present

## 2022-01-10 DIAGNOSIS — R000 Tachycardia, unspecified: Secondary | ICD-10-CM | POA: Diagnosis not present

## 2022-01-15 ENCOUNTER — Ambulatory Visit
Admission: RE | Admit: 2022-01-15 | Discharge: 2022-01-15 | Disposition: A | Discharge status: Home / Self Care | Payer: 59 | Source: Ambulatory Visit | Attending: Physician Assistant | Admitting: Physician Assistant

## 2022-01-15 ENCOUNTER — Other Ambulatory Visit: Payer: Self-pay | Admitting: Physician Assistant

## 2022-01-15 DIAGNOSIS — D242 Benign neoplasm of left breast: Secondary | ICD-10-CM

## 2022-01-15 DIAGNOSIS — R922 Inconclusive mammogram: Secondary | ICD-10-CM | POA: Diagnosis not present

## 2022-01-15 DIAGNOSIS — N6001 Solitary cyst of right breast: Secondary | ICD-10-CM | POA: Diagnosis not present

## 2022-01-15 DIAGNOSIS — R928 Other abnormal and inconclusive findings on diagnostic imaging of breast: Secondary | ICD-10-CM

## 2022-01-15 DIAGNOSIS — Z803 Family history of malignant neoplasm of breast: Secondary | ICD-10-CM | POA: Diagnosis not present

## 2022-01-17 ENCOUNTER — Other Ambulatory Visit: Payer: 59

## 2022-01-17 ENCOUNTER — Other Ambulatory Visit (HOSPITAL_COMMUNITY): Payer: Self-pay

## 2022-01-18 ENCOUNTER — Ambulatory Visit
Admission: RE | Admit: 2022-01-18 | Discharge: 2022-01-18 | Disposition: A | Discharge status: Home / Self Care | Payer: 59 | Source: Ambulatory Visit | Attending: Physician Assistant | Admitting: Physician Assistant

## 2022-01-18 DIAGNOSIS — D242 Benign neoplasm of left breast: Secondary | ICD-10-CM

## 2022-01-18 DIAGNOSIS — N6321 Unspecified lump in the left breast, upper outer quadrant: Secondary | ICD-10-CM | POA: Diagnosis not present

## 2022-01-20 DIAGNOSIS — Z6830 Body mass index (BMI) 30.0-30.9, adult: Secondary | ICD-10-CM | POA: Diagnosis not present

## 2022-01-20 DIAGNOSIS — R0602 Shortness of breath: Secondary | ICD-10-CM | POA: Diagnosis not present

## 2022-01-20 DIAGNOSIS — R000 Tachycardia, unspecified: Secondary | ICD-10-CM | POA: Diagnosis not present

## 2022-01-20 DIAGNOSIS — E7800 Pure hypercholesterolemia, unspecified: Secondary | ICD-10-CM | POA: Diagnosis not present

## 2022-01-21 ENCOUNTER — Other Ambulatory Visit: Payer: 59

## 2022-01-26 ENCOUNTER — Other Ambulatory Visit (HOSPITAL_COMMUNITY): Payer: Self-pay

## 2022-02-02 ENCOUNTER — Other Ambulatory Visit (HOSPITAL_COMMUNITY): Payer: Self-pay

## 2022-02-02 MED ORDER — METFORMIN HCL 1000 MG PO TABS
1000.0000 mg | ORAL_TABLET | Freq: Two times a day (BID) | ORAL | 11 refills | Status: DC
  Filled 2022-02-02: qty 60, 30d supply, fill #0
  Filled 2022-03-21: qty 60, 30d supply, fill #1
  Filled 2022-04-25: qty 60, 30d supply, fill #2
  Filled 2022-05-28: qty 60, 30d supply, fill #3

## 2022-02-02 MED ORDER — LETROZOLE 2.5 MG PO TABS
ORAL_TABLET | ORAL | 2 refills | Status: DC
  Filled 2022-02-02: qty 30, 30d supply, fill #0
  Filled 2022-03-22: qty 30, 30d supply, fill #1

## 2022-02-18 ENCOUNTER — Other Ambulatory Visit (HOSPITAL_COMMUNITY): Payer: Self-pay

## 2022-02-19 ENCOUNTER — Other Ambulatory Visit (HOSPITAL_COMMUNITY): Payer: Self-pay

## 2022-02-25 ENCOUNTER — Other Ambulatory Visit (HOSPITAL_COMMUNITY): Payer: Self-pay

## 2022-02-25 DIAGNOSIS — Z3183 Encounter for assisted reproductive fertility procedure cycle: Secondary | ICD-10-CM | POA: Diagnosis not present

## 2022-02-25 DIAGNOSIS — E288 Other ovarian dysfunction: Secondary | ICD-10-CM | POA: Diagnosis not present

## 2022-03-03 DIAGNOSIS — Z3183 Encounter for assisted reproductive fertility procedure cycle: Secondary | ICD-10-CM | POA: Diagnosis not present

## 2022-03-11 DIAGNOSIS — Z3200 Encounter for pregnancy test, result unknown: Secondary | ICD-10-CM | POA: Diagnosis not present

## 2022-03-16 ENCOUNTER — Other Ambulatory Visit (HOSPITAL_COMMUNITY): Payer: Self-pay

## 2022-03-17 ENCOUNTER — Other Ambulatory Visit (HOSPITAL_COMMUNITY): Payer: Self-pay

## 2022-03-21 ENCOUNTER — Other Ambulatory Visit (HOSPITAL_COMMUNITY): Payer: Self-pay

## 2022-03-22 ENCOUNTER — Other Ambulatory Visit (HOSPITAL_COMMUNITY): Payer: Self-pay

## 2022-03-28 ENCOUNTER — Other Ambulatory Visit (HOSPITAL_COMMUNITY): Payer: Self-pay

## 2022-03-28 DIAGNOSIS — Z3183 Encounter for assisted reproductive fertility procedure cycle: Secondary | ICD-10-CM | POA: Diagnosis not present

## 2022-03-28 DIAGNOSIS — E288 Other ovarian dysfunction: Secondary | ICD-10-CM | POA: Diagnosis not present

## 2022-03-28 MED ORDER — METHYLPREDNISOLONE 8 MG PO TABS
ORAL_TABLET | ORAL | 1 refills | Status: DC
  Filled 2022-03-28: qty 16, 8d supply, fill #0

## 2022-03-29 ENCOUNTER — Other Ambulatory Visit (HOSPITAL_COMMUNITY): Payer: Self-pay

## 2022-03-30 ENCOUNTER — Other Ambulatory Visit (HOSPITAL_COMMUNITY): Payer: Self-pay

## 2022-04-04 ENCOUNTER — Other Ambulatory Visit (HOSPITAL_COMMUNITY): Payer: Self-pay

## 2022-04-04 DIAGNOSIS — Z3141 Encounter for fertility testing: Secondary | ICD-10-CM | POA: Diagnosis not present

## 2022-04-04 MED ORDER — DOXYCYCLINE HYCLATE 100 MG PO CAPS
ORAL_CAPSULE | ORAL | 0 refills | Status: DC
  Filled 2022-04-04: qty 10, 5d supply, fill #0

## 2022-04-11 ENCOUNTER — Other Ambulatory Visit (HOSPITAL_COMMUNITY): Payer: Self-pay

## 2022-04-11 MED ORDER — NIFEDIPINE ER OSMOTIC RELEASE 30 MG PO TB24
30.0000 mg | ORAL_TABLET | Freq: Every day | ORAL | 5 refills | Status: DC
  Filled 2022-04-11: qty 30, 30d supply, fill #0
  Filled 2022-05-15: qty 30, 30d supply, fill #1
  Filled 2022-06-15: qty 30, 30d supply, fill #2
  Filled 2022-07-19: qty 30, 30d supply, fill #3
  Filled 2022-11-09: qty 30, 30d supply, fill #4
  Filled 2022-12-10: qty 30, 30d supply, fill #5

## 2022-04-12 ENCOUNTER — Other Ambulatory Visit (HOSPITAL_COMMUNITY): Payer: Self-pay

## 2022-04-13 ENCOUNTER — Other Ambulatory Visit (HOSPITAL_COMMUNITY): Payer: Self-pay

## 2022-04-20 DIAGNOSIS — Z3183 Encounter for assisted reproductive fertility procedure cycle: Secondary | ICD-10-CM | POA: Diagnosis not present

## 2022-04-21 ENCOUNTER — Other Ambulatory Visit (HOSPITAL_COMMUNITY): Payer: Self-pay

## 2022-04-21 DIAGNOSIS — E782 Mixed hyperlipidemia: Secondary | ICD-10-CM | POA: Diagnosis not present

## 2022-04-21 DIAGNOSIS — R0602 Shortness of breath: Secondary | ICD-10-CM | POA: Diagnosis not present

## 2022-04-21 DIAGNOSIS — R000 Tachycardia, unspecified: Secondary | ICD-10-CM | POA: Diagnosis not present

## 2022-04-21 DIAGNOSIS — I10 Essential (primary) hypertension: Secondary | ICD-10-CM | POA: Diagnosis not present

## 2022-04-25 ENCOUNTER — Other Ambulatory Visit (HOSPITAL_COMMUNITY): Payer: Self-pay

## 2022-04-26 ENCOUNTER — Other Ambulatory Visit (HOSPITAL_COMMUNITY): Payer: Self-pay

## 2022-04-28 ENCOUNTER — Other Ambulatory Visit (HOSPITAL_COMMUNITY): Payer: Self-pay

## 2022-04-28 MED ORDER — ESTRADIOL 0.1 MG/24HR TD PTTW
MEDICATED_PATCH | TRANSDERMAL | 3 refills | Status: DC
  Filled 2022-04-28: qty 8, 28d supply, fill #0
  Filled 2022-05-26: qty 8, 28d supply, fill #1

## 2022-04-29 DIAGNOSIS — Z3183 Encounter for assisted reproductive fertility procedure cycle: Secondary | ICD-10-CM | POA: Diagnosis not present

## 2022-05-03 DIAGNOSIS — Z3200 Encounter for pregnancy test, result unknown: Secondary | ICD-10-CM | POA: Diagnosis not present

## 2022-05-03 DIAGNOSIS — Z3183 Encounter for assisted reproductive fertility procedure cycle: Secondary | ICD-10-CM | POA: Diagnosis not present

## 2022-05-06 DIAGNOSIS — Z3183 Encounter for assisted reproductive fertility procedure cycle: Secondary | ICD-10-CM | POA: Diagnosis not present

## 2022-05-10 ENCOUNTER — Other Ambulatory Visit (HOSPITAL_COMMUNITY): Payer: Self-pay

## 2022-05-10 DIAGNOSIS — Z3200 Encounter for pregnancy test, result unknown: Secondary | ICD-10-CM | POA: Diagnosis not present

## 2022-05-10 MED ORDER — ESTRADIOL 2 MG PO TABS
ORAL_TABLET | ORAL | 3 refills | Status: DC
  Filled 2022-05-10: qty 60, 30d supply, fill #0

## 2022-05-14 ENCOUNTER — Other Ambulatory Visit (HOSPITAL_COMMUNITY): Payer: Self-pay

## 2022-05-16 ENCOUNTER — Other Ambulatory Visit (HOSPITAL_COMMUNITY): Payer: Self-pay

## 2022-05-21 ENCOUNTER — Other Ambulatory Visit (HOSPITAL_COMMUNITY): Payer: Self-pay

## 2022-05-23 DIAGNOSIS — Z3200 Encounter for pregnancy test, result unknown: Secondary | ICD-10-CM | POA: Diagnosis not present

## 2022-05-26 ENCOUNTER — Other Ambulatory Visit (HOSPITAL_COMMUNITY): Payer: Self-pay

## 2022-05-30 ENCOUNTER — Other Ambulatory Visit (HOSPITAL_COMMUNITY): Payer: Self-pay

## 2022-06-06 DIAGNOSIS — O0900 Supervision of pregnancy with history of infertility, unspecified trimester: Secondary | ICD-10-CM | POA: Diagnosis not present

## 2022-06-15 ENCOUNTER — Other Ambulatory Visit (HOSPITAL_COMMUNITY): Payer: Self-pay

## 2022-06-15 MED ORDER — PROGESTERONE 200 MG PO CAPS
200.0000 mg | ORAL_CAPSULE | Freq: Every day | ORAL | 0 refills | Status: DC
  Filled 2022-06-15: qty 14, 14d supply, fill #0

## 2022-06-27 DIAGNOSIS — O09811 Supervision of pregnancy resulting from assisted reproductive technology, first trimester: Secondary | ICD-10-CM | POA: Diagnosis not present

## 2022-06-27 DIAGNOSIS — Z124 Encounter for screening for malignant neoplasm of cervix: Secondary | ICD-10-CM | POA: Diagnosis not present

## 2022-06-27 DIAGNOSIS — Z113 Encounter for screening for infections with a predominantly sexual mode of transmission: Secondary | ICD-10-CM | POA: Diagnosis not present

## 2022-06-27 DIAGNOSIS — Z3A11 11 weeks gestation of pregnancy: Secondary | ICD-10-CM | POA: Diagnosis not present

## 2022-06-27 DIAGNOSIS — Z3480 Encounter for supervision of other normal pregnancy, unspecified trimester: Secondary | ICD-10-CM | POA: Diagnosis not present

## 2022-06-27 LAB — OB RESULTS CONSOLE ABO/RH: RH Type: POSITIVE

## 2022-06-27 LAB — OB RESULTS CONSOLE RPR: RPR: NONREACTIVE

## 2022-06-27 LAB — OB RESULTS CONSOLE HIV ANTIBODY (ROUTINE TESTING): HIV: NONREACTIVE

## 2022-06-27 LAB — OB RESULTS CONSOLE RUBELLA ANTIBODY, IGM: Rubella: IMMUNE

## 2022-06-27 LAB — OB RESULTS CONSOLE HEPATITIS B SURFACE ANTIGEN: Hepatitis B Surface Ag: NEGATIVE

## 2022-06-27 LAB — HEPATITIS C ANTIBODY: HCV Ab: NEGATIVE

## 2022-06-27 LAB — OB RESULTS CONSOLE ANTIBODY SCREEN: Antibody Screen: POSITIVE

## 2022-07-13 DIAGNOSIS — Z3A13 13 weeks gestation of pregnancy: Secondary | ICD-10-CM | POA: Diagnosis not present

## 2022-07-13 DIAGNOSIS — R760 Raised antibody titer: Secondary | ICD-10-CM | POA: Diagnosis not present

## 2022-07-13 DIAGNOSIS — Z3480 Encounter for supervision of other normal pregnancy, unspecified trimester: Secondary | ICD-10-CM | POA: Diagnosis not present

## 2022-07-13 DIAGNOSIS — Z369 Encounter for antenatal screening, unspecified: Secondary | ICD-10-CM | POA: Diagnosis not present

## 2022-07-18 ENCOUNTER — Other Ambulatory Visit: Payer: Self-pay | Admitting: Obstetrics

## 2022-07-18 ENCOUNTER — Other Ambulatory Visit: Payer: Self-pay

## 2022-07-18 DIAGNOSIS — Z363 Encounter for antenatal screening for malformations: Secondary | ICD-10-CM

## 2022-07-19 ENCOUNTER — Other Ambulatory Visit (HOSPITAL_COMMUNITY): Payer: Self-pay

## 2022-08-11 ENCOUNTER — Other Ambulatory Visit (HOSPITAL_COMMUNITY): Payer: Self-pay

## 2022-08-11 DIAGNOSIS — Z3A17 17 weeks gestation of pregnancy: Secondary | ICD-10-CM | POA: Diagnosis not present

## 2022-08-11 DIAGNOSIS — Z3480 Encounter for supervision of other normal pregnancy, unspecified trimester: Secondary | ICD-10-CM | POA: Diagnosis not present

## 2022-08-11 DIAGNOSIS — Z369 Encounter for antenatal screening, unspecified: Secondary | ICD-10-CM | POA: Diagnosis not present

## 2022-08-11 MED ORDER — NIFEDIPINE ER OSMOTIC RELEASE 30 MG PO TB24
30.0000 mg | ORAL_TABLET | Freq: Every day | ORAL | 3 refills | Status: DC
  Filled 2022-08-11: qty 90, 90d supply, fill #0

## 2022-08-12 ENCOUNTER — Other Ambulatory Visit (HOSPITAL_COMMUNITY): Payer: Self-pay

## 2022-08-17 ENCOUNTER — Encounter: Payer: Self-pay | Admitting: *Deleted

## 2022-08-23 ENCOUNTER — Ambulatory Visit: Payer: 59 | Admitting: *Deleted

## 2022-08-23 ENCOUNTER — Ambulatory Visit: Payer: 59 | Attending: Obstetrics

## 2022-08-23 ENCOUNTER — Other Ambulatory Visit: Payer: Self-pay | Admitting: *Deleted

## 2022-08-23 VITALS — BP 123/87 | HR 113

## 2022-08-23 DIAGNOSIS — Z363 Encounter for antenatal screening for malformations: Secondary | ICD-10-CM

## 2022-08-23 DIAGNOSIS — O09812 Supervision of pregnancy resulting from assisted reproductive technology, second trimester: Secondary | ICD-10-CM

## 2022-08-23 DIAGNOSIS — D259 Leiomyoma of uterus, unspecified: Secondary | ICD-10-CM

## 2022-08-23 DIAGNOSIS — O09522 Supervision of elderly multigravida, second trimester: Secondary | ICD-10-CM | POA: Diagnosis not present

## 2022-08-23 DIAGNOSIS — O10912 Unspecified pre-existing hypertension complicating pregnancy, second trimester: Secondary | ICD-10-CM

## 2022-09-08 DIAGNOSIS — Z369 Encounter for antenatal screening, unspecified: Secondary | ICD-10-CM | POA: Diagnosis not present

## 2022-09-16 DIAGNOSIS — Z3A22 22 weeks gestation of pregnancy: Secondary | ICD-10-CM | POA: Diagnosis not present

## 2022-09-16 DIAGNOSIS — O09812 Supervision of pregnancy resulting from assisted reproductive technology, second trimester: Secondary | ICD-10-CM | POA: Diagnosis not present

## 2022-09-20 ENCOUNTER — Ambulatory Visit: Payer: Commercial Managed Care - PPO | Admitting: *Deleted

## 2022-09-20 ENCOUNTER — Ambulatory Visit: Payer: Commercial Managed Care - PPO | Attending: Obstetrics

## 2022-09-20 VITALS — BP 123/75 | HR 118

## 2022-09-20 DIAGNOSIS — O361920 Maternal care for other isoimmunization, second trimester, not applicable or unspecified: Secondary | ICD-10-CM

## 2022-09-20 DIAGNOSIS — Z3A23 23 weeks gestation of pregnancy: Secondary | ICD-10-CM | POA: Diagnosis not present

## 2022-09-20 DIAGNOSIS — D259 Leiomyoma of uterus, unspecified: Secondary | ICD-10-CM | POA: Insufficient documentation

## 2022-09-20 DIAGNOSIS — O3412 Maternal care for benign tumor of corpus uteri, second trimester: Secondary | ICD-10-CM | POA: Diagnosis not present

## 2022-09-20 DIAGNOSIS — O09522 Supervision of elderly multigravida, second trimester: Secondary | ICD-10-CM

## 2022-09-20 DIAGNOSIS — O26892 Other specified pregnancy related conditions, second trimester: Secondary | ICD-10-CM

## 2022-09-20 DIAGNOSIS — O10912 Unspecified pre-existing hypertension complicating pregnancy, second trimester: Secondary | ICD-10-CM

## 2022-09-20 DIAGNOSIS — O10012 Pre-existing essential hypertension complicating pregnancy, second trimester: Secondary | ICD-10-CM | POA: Diagnosis not present

## 2022-09-20 DIAGNOSIS — O3429 Maternal care due to uterine scar from other previous surgery: Secondary | ICD-10-CM

## 2022-09-20 DIAGNOSIS — O09812 Supervision of pregnancy resulting from assisted reproductive technology, second trimester: Secondary | ICD-10-CM | POA: Insufficient documentation

## 2022-09-20 DIAGNOSIS — Z148 Genetic carrier of other disease: Secondary | ICD-10-CM | POA: Diagnosis not present

## 2022-10-04 DIAGNOSIS — Z369 Encounter for antenatal screening, unspecified: Secondary | ICD-10-CM | POA: Diagnosis not present

## 2022-10-04 DIAGNOSIS — O289 Unspecified abnormal findings on antenatal screening of mother: Secondary | ICD-10-CM | POA: Diagnosis not present

## 2022-10-20 DIAGNOSIS — Z23 Encounter for immunization: Secondary | ICD-10-CM | POA: Diagnosis not present

## 2022-10-20 DIAGNOSIS — Z3480 Encounter for supervision of other normal pregnancy, unspecified trimester: Secondary | ICD-10-CM | POA: Diagnosis not present

## 2022-10-20 DIAGNOSIS — I10 Essential (primary) hypertension: Secondary | ICD-10-CM | POA: Diagnosis not present

## 2022-10-20 DIAGNOSIS — Z3A27 27 weeks gestation of pregnancy: Secondary | ICD-10-CM | POA: Diagnosis not present

## 2022-10-25 DIAGNOSIS — O99810 Abnormal glucose complicating pregnancy: Secondary | ICD-10-CM | POA: Diagnosis not present

## 2022-10-31 DIAGNOSIS — I10 Essential (primary) hypertension: Secondary | ICD-10-CM | POA: Diagnosis not present

## 2022-10-31 DIAGNOSIS — Z369 Encounter for antenatal screening, unspecified: Secondary | ICD-10-CM | POA: Diagnosis not present

## 2022-10-31 DIAGNOSIS — Z3493 Encounter for supervision of normal pregnancy, unspecified, third trimester: Secondary | ICD-10-CM | POA: Diagnosis not present

## 2022-10-31 DIAGNOSIS — R000 Tachycardia, unspecified: Secondary | ICD-10-CM | POA: Diagnosis not present

## 2022-11-09 ENCOUNTER — Other Ambulatory Visit (HOSPITAL_COMMUNITY): Payer: Self-pay

## 2022-11-09 ENCOUNTER — Encounter: Payer: Self-pay | Admitting: Registered"

## 2022-11-09 ENCOUNTER — Encounter: Payer: Commercial Managed Care - PPO | Attending: Obstetrics and Gynecology | Admitting: Registered"

## 2022-11-09 DIAGNOSIS — O24419 Gestational diabetes mellitus in pregnancy, unspecified control: Secondary | ICD-10-CM | POA: Insufficient documentation

## 2022-11-09 MED ORDER — FREESTYLE LANCETS MISC
3 refills | Status: DC
  Filled 2022-11-09: qty 100, 25d supply, fill #0
  Filled 2022-12-14: qty 100, 25d supply, fill #1

## 2022-11-09 MED ORDER — ACCU-CHEK GUIDE VI STRP
ORAL_STRIP | 3 refills | Status: DC
  Filled 2022-11-09: qty 100, 25d supply, fill #0
  Filled 2022-12-05: qty 100, 25d supply, fill #1

## 2022-11-09 NOTE — Progress Notes (Signed)
The following learning objectives were met by the patient during this course:   States the definition of Gestational Diabetes States why dietary management is important in controlling blood glucose Describes the effects each nutrient has on blood glucose levels Demonstrates ability to create a balanced meal plan Demonstrates carbohydrate counting  States when to check blood glucose levels Demonstrates proper blood glucose monitoring techniques States the effect of stress and exercise on blood glucose levels States the importance of limiting caffeine and abstaining from alcohol and smoking   Blood glucose monitor given: Accu-chek Guide Me Lot [id number] Exp: 2023-07-18 CBG: 70 mg/dL   Patient instructed to monitor glucose levels: FBS: 60 - <95; 1 hour: <140; 2 hour: <120   Patient received handouts: Nutrition Diabetes and Pregnancy, including carb counting list and Glucose log sheet   Patient will be seen for follow-up as needed.

## 2022-11-10 ENCOUNTER — Other Ambulatory Visit (HOSPITAL_COMMUNITY): Payer: Self-pay

## 2022-11-10 MED ORDER — FREESTYLE LITE W/DEVICE KIT
PACK | Freq: Four times a day (QID) | 0 refills | Status: DC
Start: 2022-11-10 — End: 2022-12-30
  Filled 2022-11-10: qty 1, 30d supply, fill #0

## 2022-11-17 DIAGNOSIS — O24113 Pre-existing diabetes mellitus, type 2, in pregnancy, third trimester: Secondary | ICD-10-CM | POA: Diagnosis not present

## 2022-11-17 DIAGNOSIS — O163 Unspecified maternal hypertension, third trimester: Secondary | ICD-10-CM | POA: Diagnosis not present

## 2022-11-17 DIAGNOSIS — O09513 Supervision of elderly primigravida, third trimester: Secondary | ICD-10-CM | POA: Diagnosis not present

## 2022-11-25 DIAGNOSIS — O09811 Supervision of pregnancy resulting from assisted reproductive technology, first trimester: Secondary | ICD-10-CM | POA: Diagnosis not present

## 2022-11-25 DIAGNOSIS — Z3A32 32 weeks gestation of pregnancy: Secondary | ICD-10-CM | POA: Diagnosis not present

## 2022-12-01 DIAGNOSIS — Z3A33 33 weeks gestation of pregnancy: Secondary | ICD-10-CM | POA: Diagnosis not present

## 2022-12-01 DIAGNOSIS — O09811 Supervision of pregnancy resulting from assisted reproductive technology, first trimester: Secondary | ICD-10-CM | POA: Diagnosis not present

## 2022-12-05 ENCOUNTER — Other Ambulatory Visit (HOSPITAL_COMMUNITY): Payer: Self-pay

## 2022-12-12 ENCOUNTER — Other Ambulatory Visit: Payer: Self-pay

## 2022-12-12 DIAGNOSIS — Z3A35 35 weeks gestation of pregnancy: Secondary | ICD-10-CM | POA: Diagnosis not present

## 2022-12-12 DIAGNOSIS — O09811 Supervision of pregnancy resulting from assisted reproductive technology, first trimester: Secondary | ICD-10-CM | POA: Diagnosis not present

## 2022-12-13 ENCOUNTER — Telehealth (HOSPITAL_COMMUNITY): Payer: Self-pay | Admitting: *Deleted

## 2022-12-13 ENCOUNTER — Encounter (HOSPITAL_COMMUNITY): Payer: Self-pay

## 2022-12-13 NOTE — Telephone Encounter (Signed)
Preadmission screen  

## 2022-12-13 NOTE — Patient Instructions (Signed)
Melissa Liu  12/13/2022   Your procedure is scheduled on:  12/27/2022  Arrive at 0530 at Entrance C on [address] at [facility]  and [employer]. You are invited to use the FREE valet parking or use the Visitor's parking deck.  Pick up the phone at the desk and dial [phone].  Call this number if you have problems the morning of surgery: [phone]  Remember:   Do not eat food:(After Midnight) Desps de medianoche.  Do not drink clear liquids: (After Midnight) Desps de medianoche.  Take these medicines the morning of surgery with A SIP OF WATER:  Nifedipine XL   Do not wear jewelry, make-up or nail polish.  Do not wear lotions, powders, or perfumes. Do not wear deodorant.  Do not shave 48 hours prior to surgery.  Do not bring valuables to the hospital.  [facility] is not   responsible for any belongings or valuables brought to the hospital.  Contacts, dentures or bridgework may not be worn into surgery.  Leave suitcase in the car. After surgery it may be brought to your room.  For patients admitted to the hospital, checkout time is 11:00 AM the day of              discharge.      Please read over the following fact sheets that you were given:     Preparing for Surgery

## 2022-12-14 ENCOUNTER — Encounter (HOSPITAL_COMMUNITY): Payer: Self-pay

## 2022-12-17 ENCOUNTER — Other Ambulatory Visit (HOSPITAL_COMMUNITY): Payer: Self-pay

## 2022-12-22 DIAGNOSIS — Z3A36 36 weeks gestation of pregnancy: Secondary | ICD-10-CM | POA: Diagnosis not present

## 2022-12-22 DIAGNOSIS — Z369 Encounter for antenatal screening, unspecified: Secondary | ICD-10-CM | POA: Diagnosis not present

## 2022-12-22 DIAGNOSIS — O09811 Supervision of pregnancy resulting from assisted reproductive technology, first trimester: Secondary | ICD-10-CM | POA: Diagnosis not present

## 2022-12-22 DIAGNOSIS — Z3480 Encounter for supervision of other normal pregnancy, unspecified trimester: Secondary | ICD-10-CM | POA: Diagnosis not present

## 2022-12-26 ENCOUNTER — Encounter (HOSPITAL_COMMUNITY)
Admission: RE | Admit: 2022-12-26 | Discharge: 2022-12-26 | Disposition: A | Discharge status: Home / Self Care | Payer: Commercial Managed Care - PPO | Source: Ambulatory Visit | Attending: Obstetrics | Admitting: Obstetrics

## 2022-12-26 DIAGNOSIS — O24419 Gestational diabetes mellitus in pregnancy, unspecified control: Secondary | ICD-10-CM

## 2022-12-26 HISTORY — DX: Gestational diabetes mellitus in pregnancy, unspecified control: O24.419

## 2022-12-26 LAB — CBC
HCT: 38.1 % (ref 36.0–46.0)
Hemoglobin: 12.4 g/dL (ref 12.0–15.0)
MCH: 29.4 pg (ref 26.0–34.0)
MCHC: 32.5 g/dL (ref 30.0–36.0)
MCV: 90.3 fL (ref 80.0–100.0)
Platelets: 192 10*3/uL (ref 150–400)
RBC: 4.22 MIL/uL (ref 3.87–5.11)
RDW: 13.2 % (ref 11.5–15.5)
WBC: 5.2 10*3/uL (ref 4.0–10.5)
nRBC: 0 % (ref 0.0–0.2)

## 2022-12-26 LAB — TYPE AND SCREEN
ABO/RH(D): O POS
Antibody Screen: NEGATIVE

## 2022-12-26 LAB — RPR: RPR Ser Ql: NONREACTIVE

## 2022-12-27 ENCOUNTER — Inpatient Hospital Stay (HOSPITAL_COMMUNITY): Payer: Commercial Managed Care - PPO | Admitting: Anesthesiology

## 2022-12-27 ENCOUNTER — Encounter (HOSPITAL_COMMUNITY)
Admission: RE | Disposition: A | Discharge status: Home / Self Care | Payer: Self-pay | Source: Ambulatory Visit | Attending: Obstetrics

## 2022-12-27 ENCOUNTER — Inpatient Hospital Stay (HOSPITAL_COMMUNITY)
Admission: RE | Admit: 2022-12-27 | Discharge: 2022-12-30 | DRG: 787 | Disposition: A | Discharge status: Home / Self Care | Payer: Commercial Managed Care - PPO | Source: Ambulatory Visit | Attending: Obstetrics | Admitting: Obstetrics

## 2022-12-27 ENCOUNTER — Encounter (HOSPITAL_COMMUNITY): Payer: Self-pay | Admitting: Obstetrics

## 2022-12-27 ENCOUNTER — Other Ambulatory Visit: Payer: Self-pay

## 2022-12-27 DIAGNOSIS — O9902 Anemia complicating childbirth: Secondary | ICD-10-CM | POA: Diagnosis not present

## 2022-12-27 DIAGNOSIS — D252 Subserosal leiomyoma of uterus: Secondary | ICD-10-CM | POA: Diagnosis present

## 2022-12-27 DIAGNOSIS — O24410 Gestational diabetes mellitus in pregnancy, diet controlled: Secondary | ICD-10-CM | POA: Diagnosis not present

## 2022-12-27 DIAGNOSIS — O99824 Streptococcus B carrier state complicating childbirth: Secondary | ICD-10-CM | POA: Diagnosis not present

## 2022-12-27 DIAGNOSIS — O99284 Endocrine, nutritional and metabolic diseases complicating childbirth: Secondary | ICD-10-CM | POA: Diagnosis not present

## 2022-12-27 DIAGNOSIS — E785 Hyperlipidemia, unspecified: Secondary | ICD-10-CM | POA: Diagnosis present

## 2022-12-27 DIAGNOSIS — O321XX0 Maternal care for breech presentation, not applicable or unspecified: Secondary | ICD-10-CM | POA: Diagnosis present

## 2022-12-27 DIAGNOSIS — O24429 Gestational diabetes mellitus in childbirth, unspecified control: Secondary | ICD-10-CM | POA: Diagnosis not present

## 2022-12-27 DIAGNOSIS — Z3403 Encounter for supervision of normal first pregnancy, third trimester: Secondary | ICD-10-CM

## 2022-12-27 DIAGNOSIS — O3413 Maternal care for benign tumor of corpus uteri, third trimester: Secondary | ICD-10-CM | POA: Diagnosis present

## 2022-12-27 DIAGNOSIS — O1092 Unspecified pre-existing hypertension complicating childbirth: Secondary | ICD-10-CM | POA: Diagnosis not present

## 2022-12-27 DIAGNOSIS — O99344 Other mental disorders complicating childbirth: Secondary | ICD-10-CM

## 2022-12-27 DIAGNOSIS — O24419 Gestational diabetes mellitus in pregnancy, unspecified control: Principal | ICD-10-CM

## 2022-12-27 DIAGNOSIS — O0913 Supervision of pregnancy with history of ectopic or molar pregnancy, third trimester: Secondary | ICD-10-CM | POA: Diagnosis not present

## 2022-12-27 DIAGNOSIS — E669 Obesity, unspecified: Secondary | ICD-10-CM | POA: Diagnosis not present

## 2022-12-27 DIAGNOSIS — Z3A37 37 weeks gestation of pregnancy: Secondary | ICD-10-CM | POA: Diagnosis not present

## 2022-12-27 DIAGNOSIS — O26893 Other specified pregnancy related conditions, third trimester: Secondary | ICD-10-CM | POA: Diagnosis present

## 2022-12-27 DIAGNOSIS — O1002 Pre-existing essential hypertension complicating childbirth: Secondary | ICD-10-CM | POA: Diagnosis not present

## 2022-12-27 DIAGNOSIS — O10013 Pre-existing essential hypertension complicating pregnancy, third trimester: Secondary | ICD-10-CM | POA: Diagnosis present

## 2022-12-27 DIAGNOSIS — O24420 Gestational diabetes mellitus in childbirth, diet controlled: Secondary | ICD-10-CM | POA: Diagnosis not present

## 2022-12-27 DIAGNOSIS — O99214 Obesity complicating childbirth: Secondary | ICD-10-CM | POA: Diagnosis not present

## 2022-12-27 DIAGNOSIS — O09523 Supervision of elderly multigravida, third trimester: Secondary | ICD-10-CM | POA: Diagnosis not present

## 2022-12-27 DIAGNOSIS — D62 Acute posthemorrhagic anemia: Secondary | ICD-10-CM | POA: Diagnosis not present

## 2022-12-27 DIAGNOSIS — F419 Anxiety disorder, unspecified: Secondary | ICD-10-CM | POA: Diagnosis not present

## 2022-12-27 LAB — ABO/RH: ABO/RH(D): O POS

## 2022-12-27 LAB — GLUCOSE, CAPILLARY
Glucose-Capillary: 57 mg/dL — ABNORMAL LOW (ref 70–99)
Glucose-Capillary: 113 mg/dL — ABNORMAL HIGH (ref 70–99)
Glucose-Capillary: 88 mg/dL (ref 70–99)

## 2022-12-27 SURGERY — Surgical Case
Anesthesia: Spinal

## 2022-12-27 MED ORDER — MEPERIDINE HCL 25 MG/ML IJ SOLN: 6.2500 mg | INTRAMUSCULAR | Status: DC | PRN

## 2022-12-27 MED ORDER — STERILE WATER FOR IRRIGATION IR SOLN
Status: DC | PRN
  Administered 2022-12-27: 1000 mL

## 2022-12-27 MED ORDER — ONDANSETRON HCL 4 MG/2ML IJ SOLN
INTRAMUSCULAR | Status: AC
  Filled 2022-12-27: qty 2

## 2022-12-27 MED ORDER — FENTANYL CITRATE (PF) 100 MCG/2ML IJ SOLN: 25.0000 ug | INTRAMUSCULAR | Status: DC | PRN

## 2022-12-27 MED ORDER — DIPHENHYDRAMINE HCL 50 MG/ML IJ SOLN
12.5000 mg | Freq: Four times a day (QID) | INTRAMUSCULAR | Status: DC | PRN
  Administered 2022-12-27 (×2): 12.5 mg via INTRAVENOUS
  Filled 2022-12-27 (×2): qty 1

## 2022-12-27 MED ORDER — LACTATED RINGERS IV SOLN: INTRAVENOUS | Status: DC

## 2022-12-27 MED ORDER — ACETAMINOPHEN 10 MG/ML IV SOLN
1000.0000 mg | Freq: Once | INTRAVENOUS | Status: DC | PRN
  Administered 2022-12-27: 1000 mg via INTRAVENOUS

## 2022-12-27 MED ORDER — KETOROLAC TROMETHAMINE 30 MG/ML IJ SOLN
30.0000 mg | Freq: Four times a day (QID) | INTRAMUSCULAR | Status: AC
  Administered 2022-12-27 – 2022-12-28 (×3): 30 mg via INTRAVENOUS
  Filled 2022-12-27 (×4): qty 1

## 2022-12-27 MED ORDER — PHENYLEPHRINE HCL-NACL 20-0.9 MG/250ML-% IV SOLN
INTRAVENOUS | Status: DC | PRN
  Administered 2022-12-27: 30 ug/min via INTRAVENOUS

## 2022-12-27 MED ORDER — ACETAMINOPHEN 500 MG PO TABS
1000.0000 mg | ORAL_TABLET | Freq: Four times a day (QID) | ORAL | Status: DC
  Administered 2022-12-27 – 2022-12-30 (×11): 1000 mg via ORAL
  Filled 2022-12-27 (×11): qty 2

## 2022-12-27 MED ORDER — DEXTROSE 5 % IV BOLUS
500.0000 mL | Freq: Once | INTRAVENOUS | Status: AC
  Administered 2022-12-27: 500 mL via INTRAVENOUS

## 2022-12-27 MED ORDER — OXYTOCIN-SODIUM CHLORIDE 30-0.9 UT/500ML-% IV SOLN
INTRAVENOUS | Status: DC | PRN
  Administered 2022-12-27: 300 mL via INTRAVENOUS

## 2022-12-27 MED ORDER — OXYTOCIN-SODIUM CHLORIDE 30-0.9 UT/500ML-% IV SOLN
INTRAVENOUS | Status: AC
  Filled 2022-12-27: qty 500

## 2022-12-27 MED ORDER — SIMETHICONE 80 MG PO CHEW: 80.0000 mg | CHEWABLE_TABLET | ORAL | Status: DC | PRN

## 2022-12-27 MED ORDER — NALOXONE HCL 4 MG/10ML IJ SOLN: 1.0000 ug/kg/h | INTRAVENOUS | Status: DC | PRN

## 2022-12-27 MED ORDER — OXYCODONE HCL 5 MG/5ML PO SOLN: 5.0000 mg | Freq: Once | ORAL | Status: DC | PRN

## 2022-12-27 MED ORDER — PROMETHAZINE HCL 25 MG/ML IJ SOLN: 6.2500 mg | INTRAMUSCULAR | Status: DC | PRN

## 2022-12-27 MED ORDER — DEXTROSE 50 % IV SOLN
INTRAVENOUS | Status: AC
  Filled 2022-12-27: qty 50

## 2022-12-27 MED ORDER — SODIUM CHLORIDE 0.9% FLUSH: 3.0000 mL | INTRAVENOUS | Status: DC | PRN

## 2022-12-27 MED ORDER — AMISULPRIDE (ANTIEMETIC) 5 MG/2ML IV SOLN: 10.0000 mg | Freq: Once | INTRAVENOUS | Status: DC | PRN

## 2022-12-27 MED ORDER — NIFEDIPINE ER OSMOTIC RELEASE 30 MG PO TB24
30.0000 mg | ORAL_TABLET | Freq: Every day | ORAL | Status: DC
  Administered 2022-12-28 – 2022-12-30 (×3): 30 mg via ORAL
  Filled 2022-12-27 (×3): qty 1

## 2022-12-27 MED ORDER — PHENYLEPHRINE 80 MCG/ML (10ML) SYRINGE FOR IV PUSH (FOR BLOOD PRESSURE SUPPORT)
PREFILLED_SYRINGE | INTRAVENOUS | Status: DC | PRN
  Administered 2022-12-27: 80 ug via INTRAVENOUS

## 2022-12-27 MED ORDER — KETOROLAC TROMETHAMINE 30 MG/ML IJ SOLN
INTRAMUSCULAR | Status: AC
  Filled 2022-12-27: qty 1

## 2022-12-27 MED ORDER — MORPHINE SULFATE (PF) 0.5 MG/ML IJ SOLN
INTRAMUSCULAR | Status: AC
  Filled 2022-12-27: qty 10

## 2022-12-27 MED ORDER — POVIDONE-IODINE 10 % EX SWAB
2.0000 | Freq: Once | CUTANEOUS | Status: AC
  Administered 2022-12-27: 2 via TOPICAL

## 2022-12-27 MED ORDER — SOD CITRATE-CITRIC ACID 500-334 MG/5ML PO SOLN
ORAL | Status: AC
  Filled 2022-12-27: qty 30

## 2022-12-27 MED ORDER — ACETAMINOPHEN 10 MG/ML IV SOLN
INTRAVENOUS | Status: AC
  Filled 2022-12-27: qty 100

## 2022-12-27 MED ORDER — ONDANSETRON HCL 4 MG/2ML IJ SOLN
INTRAMUSCULAR | Status: DC | PRN
  Administered 2022-12-27: 4 mg via INTRAVENOUS

## 2022-12-27 MED ORDER — DIBUCAINE (PERIANAL) 1 % EX OINT: 1.0000 | TOPICAL_OINTMENT | CUTANEOUS | Status: DC | PRN

## 2022-12-27 MED ORDER — CEFAZOLIN SODIUM-DEXTROSE 2-4 GM/100ML-% IV SOLN
2.0000 g | INTRAVENOUS | Status: AC
  Administered 2022-12-27: 2 g via INTRAVENOUS

## 2022-12-27 MED ORDER — OXYCODONE HCL 5 MG PO TABS
5.0000 mg | ORAL_TABLET | ORAL | Status: DC | PRN
  Administered 2022-12-28: 5 mg via ORAL
  Administered 2022-12-28 – 2022-12-29 (×2): 10 mg via ORAL
  Administered 2022-12-29: 5 mg via ORAL
  Administered 2022-12-29: 10 mg via ORAL
  Filled 2022-12-27: qty 2
  Filled 2022-12-27: qty 1
  Filled 2022-12-27 (×2): qty 2
  Filled 2022-12-27: qty 1

## 2022-12-27 MED ORDER — SIMETHICONE 80 MG PO CHEW
80.0000 mg | CHEWABLE_TABLET | Freq: Three times a day (TID) | ORAL | Status: DC
  Administered 2022-12-27 – 2022-12-30 (×9): 80 mg via ORAL
  Filled 2022-12-27 (×10): qty 1

## 2022-12-27 MED ORDER — PHENYLEPHRINE 80 MCG/ML (10ML) SYRINGE FOR IV PUSH (FOR BLOOD PRESSURE SUPPORT)
PREFILLED_SYRINGE | INTRAVENOUS | Status: AC
  Filled 2022-12-27: qty 10

## 2022-12-27 MED ORDER — SENNOSIDES-DOCUSATE SODIUM 8.6-50 MG PO TABS
2.0000 | ORAL_TABLET | ORAL | Status: DC
  Administered 2022-12-27 – 2022-12-30 (×4): 2 via ORAL
  Filled 2022-12-27 (×4): qty 2

## 2022-12-27 MED ORDER — OXYTOCIN-SODIUM CHLORIDE 30-0.9 UT/500ML-% IV SOLN: 2.5000 [IU]/h | INTRAVENOUS | Status: AC

## 2022-12-27 MED ORDER — SCOPOLAMINE 1 MG/3DAYS TD PT72
1.0000 | MEDICATED_PATCH | Freq: Once | TRANSDERMAL | Status: AC
  Administered 2022-12-27: 1.5 mg via TRANSDERMAL
  Filled 2022-12-27: qty 1

## 2022-12-27 MED ORDER — CEFAZOLIN SODIUM-DEXTROSE 2-4 GM/100ML-% IV SOLN
INTRAVENOUS | Status: AC
  Filled 2022-12-27: qty 100

## 2022-12-27 MED ORDER — PHENYLEPHRINE HCL-NACL 20-0.9 MG/250ML-% IV SOLN
INTRAVENOUS | Status: AC
  Filled 2022-12-27: qty 250

## 2022-12-27 MED ORDER — IBUPROFEN 600 MG PO TABS
600.0000 mg | ORAL_TABLET | Freq: Four times a day (QID) | ORAL | Status: DC
  Administered 2022-12-28 – 2022-12-30 (×8): 600 mg via ORAL
  Filled 2022-12-27 (×8): qty 1

## 2022-12-27 MED ORDER — DIPHENHYDRAMINE HCL 25 MG PO CAPS: 25.0000 mg | ORAL_CAPSULE | Freq: Four times a day (QID) | ORAL | Status: DC | PRN

## 2022-12-27 MED ORDER — OXYCODONE HCL 5 MG PO TABS: 5.0000 mg | ORAL_TABLET | Freq: Once | ORAL | Status: DC | PRN

## 2022-12-27 MED ORDER — DEXTROSE 50 % IV SOLN
25.0000 g | INTRAVENOUS | Status: AC
  Administered 2022-12-27: 12.5 g via INTRAVENOUS

## 2022-12-27 MED ORDER — ONDANSETRON HCL 4 MG/2ML IJ SOLN
4.0000 mg | Freq: Three times a day (TID) | INTRAMUSCULAR | Status: DC | PRN
  Administered 2022-12-27: 4 mg via INTRAVENOUS
  Filled 2022-12-27: qty 2

## 2022-12-27 MED ORDER — NALOXONE HCL 0.4 MG/ML IJ SOLN: 0.4000 mg | INTRAMUSCULAR | Status: DC | PRN

## 2022-12-27 MED ORDER — MENTHOL 3 MG MT LOZG: 1.0000 | LOZENGE | OROMUCOSAL | Status: DC | PRN

## 2022-12-27 MED ORDER — FENTANYL CITRATE (PF) 100 MCG/2ML IJ SOLN
INTRAMUSCULAR | Status: DC | PRN
  Administered 2022-12-27: 15 ug via INTRATHECAL

## 2022-12-27 MED ORDER — DEXAMETHASONE SODIUM PHOSPHATE 4 MG/ML IJ SOLN
INTRAMUSCULAR | Status: AC
  Filled 2022-12-27: qty 1

## 2022-12-27 MED ORDER — MORPHINE SULFATE (PF) 0.5 MG/ML IJ SOLN
INTRAMUSCULAR | Status: DC | PRN
  Administered 2022-12-27: 150 ug via INTRATHECAL

## 2022-12-27 MED ORDER — FENTANYL CITRATE (PF) 100 MCG/2ML IJ SOLN
INTRAMUSCULAR | Status: AC
  Filled 2022-12-27: qty 2

## 2022-12-27 MED ORDER — WITCH HAZEL-GLYCERIN EX PADS: 1.0000 | MEDICATED_PAD | CUTANEOUS | Status: DC | PRN

## 2022-12-27 MED ORDER — DEXAMETHASONE SODIUM PHOSPHATE 10 MG/ML IJ SOLN
INTRAMUSCULAR | Status: DC | PRN
  Administered 2022-12-27: 4 mg via INTRAVENOUS

## 2022-12-27 MED ORDER — KETOROLAC TROMETHAMINE 30 MG/ML IJ SOLN
30.0000 mg | Freq: Once | INTRAMUSCULAR | Status: AC | PRN
  Administered 2022-12-27: 30 mg via INTRAVENOUS

## 2022-12-27 MED ORDER — BUPIVACAINE IN DEXTROSE 0.75-8.25 % IT SOLN
INTRATHECAL | Status: DC | PRN
  Administered 2022-12-27: 1.6 mL via INTRATHECAL

## 2022-12-27 MED ORDER — PRENATAL MULTIVITAMIN CH
1.0000 | ORAL_TABLET | Freq: Every day | ORAL | Status: DC
  Administered 2022-12-27 – 2022-12-30 (×4): 1 via ORAL
  Filled 2022-12-27 (×4): qty 1

## 2022-12-27 SURGICAL SUPPLY — 41 items
APL PRP STRL LF DISP 70% ISPRP (MISCELLANEOUS) ×2
APL SKNCLS STERI-STRIP NONHPOA (GAUZE/BANDAGES/DRESSINGS) ×1
BENZOIN TINCTURE PRP APPL 2/3 (GAUZE/BANDAGES/DRESSINGS) ×1 IMPLANT
CHLORAPREP W/TINT 26 (MISCELLANEOUS) ×2 IMPLANT
CLAMP UMBILICAL CORD (MISCELLANEOUS) ×1 IMPLANT
CLOTH BEACON ORANGE TIMEOUT ST (SAFETY) ×1 IMPLANT
DRSG OPSITE POSTOP 4X10 (GAUZE/BANDAGES/DRESSINGS) ×1 IMPLANT
ELECT REM PT RETURN 9FT ADLT (ELECTROSURGICAL) ×1
ELECTRODE REM PT RTRN 9FT ADLT (ELECTROSURGICAL) ×1 IMPLANT
EXTRACTOR VACUUM KIWI (MISCELLANEOUS) IMPLANT
GAUZE PAD ABD 7.5X8 STRL (GAUZE/BANDAGES/DRESSINGS) IMPLANT
GAUZE SPONGE 4X4 12PLY STRL LF (GAUZE/BANDAGES/DRESSINGS) IMPLANT
GLOVE BIOGEL M STER SZ 6 (GLOVE) ×1 IMPLANT
GLOVE BIOGEL PI IND STRL 6.5 (GLOVE) ×1 IMPLANT
GLOVE BIOGEL PI IND STRL 7.0 (GLOVE) ×1 IMPLANT
GOWN STRL REUS W/TWL LRG LVL3 (GOWN DISPOSABLE) ×2 IMPLANT
HEMOSTAT ARISTA ABSORB 3G PWDR (HEMOSTASIS) IMPLANT
KIT ABG SYR 3ML LUER SLIP (SYRINGE) IMPLANT
MAT PREVALON FULL STRYKER (MISCELLANEOUS) IMPLANT
NDL HYPO 25X5/8 SAFETYGLIDE (NEEDLE) IMPLANT
NEEDLE HYPO 25X5/8 SAFETYGLIDE (NEEDLE) IMPLANT
NS IRRIG 1000ML POUR BTL (IV SOLUTION) ×1 IMPLANT
PACK C SECTION WH (CUSTOM PROCEDURE TRAY) ×1 IMPLANT
PAD OB MATERNITY 4.3X12.25 (PERSONAL CARE ITEMS) ×1 IMPLANT
RTRCTR C-SECT PINK 25CM LRG (MISCELLANEOUS) ×1 IMPLANT
STRIP CLOSURE SKIN 1/2X4 (GAUZE/BANDAGES/DRESSINGS) ×1 IMPLANT
SUT MNCRL 0 VIOLET CTX 36 (SUTURE) ×2 IMPLANT
SUT MONOCRYL 0 CTX 36 (SUTURE) ×2
SUT PDS AB 0 CTX 60 (SUTURE) IMPLANT
SUT PLAIN 0 NONE (SUTURE) IMPLANT
SUT PLAIN 2 0 (SUTURE) ×1
SUT PLAIN ABS 2-0 CT1 27XMFL (SUTURE) ×1 IMPLANT
SUT VIC AB 0 CTX 36 (SUTURE) ×2
SUT VIC AB 0 CTX36XBRD ANBCTRL (SUTURE) ×2 IMPLANT
SUT VIC AB 2-0 CT1 27 (SUTURE) ×1
SUT VIC AB 2-0 CT1 TAPERPNT 27 (SUTURE) ×1 IMPLANT
SUT VIC AB 4-0 PS2 27 (SUTURE) ×1 IMPLANT
SUT VICRYL 4-0 PS2 18IN ABS (SUTURE) ×1 IMPLANT
TOWEL OR 17X24 6PK STRL BLUE (TOWEL DISPOSABLE) ×1 IMPLANT
TRAY FOLEY W/BAG SLVR 14FR LF (SET/KITS/TRAYS/PACK) ×1 IMPLANT
WATER STERILE IRR 1000ML POUR (IV SOLUTION) ×1 IMPLANT

## 2022-12-27 NOTE — Anesthesia Preprocedure Evaluation (Addendum)
Anesthesia Evaluation  Patient identified by MRN, date of birth, ID band Patient awake    Reviewed: Allergy & Precautions, NPO status , Patient's Chart, lab work & pertinent test results  Airway Mallampati: II  TM Distance: >3 FB Neck ROM: Full    Dental no notable dental hx.    Pulmonary neg pulmonary ROS   Pulmonary exam normal        Cardiovascular negative cardio ROS Normal cardiovascular exam     Neuro/Psych   Anxiety     negative neurological ROS     GI/Hepatic negative GI ROS, Neg liver ROS,,,  Endo/Other  diabetes, Gestational  PCOS (polycystic ovarian syndrome)  Renal/GU negative Renal ROS     Musculoskeletal negative musculoskeletal ROS (+)    Abdominal  (+) + obese  Peds  Hematology negative hematology ROS (+)   Anesthesia Other Findings history of cornual ectopic  Reproductive/Obstetrics                             Anesthesia Physical Anesthesia Plan  ASA: 2  Anesthesia Plan: Spinal   Post-op Pain Management:    Induction:   PONV Risk Score and Plan: 2 and Ondansetron, Dexamethasone and Treatment may vary due to age or medical condition  Airway Management Planned: Natural Airway  Additional Equipment:   Intra-op Plan:   Post-operative Plan:   Informed Consent: I have reviewed the patients History and Physical, chart, labs and discussed the procedure including the risks, benefits and alternatives for the proposed anesthesia with the patient or authorized representative who has indicated his/her understanding and acceptance.       Plan Discussed with: CRNA  Anesthesia Plan Comments:        Anesthesia Quick Evaluation

## 2022-12-27 NOTE — H&P (Signed)
37 y.o. [id number] @ [redacted]w[redacted]d presents for primary cesarean section for history of cornual ectopic.  Otherwise has good fetal movement and no bleeding.  Pregnancy complicated by: History of resection of cornual ectopic in 2019 with wedge resection of cornua entering the endometrial cavity IVF pregnancy: SET of PGT-A tested day 5 blastocyst; normal fetal ECHO GDMA1 CHTN: on nifedipine XL and well controlled this pregnancy False positive RPR 1:1; diagnostic testing negative Uterine fibroid: 3 cm anterior intramural Advanced maternal age   Past Medical History:  Diagnosis Date   Anxiety    Previously prescribed ativan but never needed it   Fibroadenoma    Gestational diabetes    Hyperlipidemia 2103   Monitored by PCP twice yearly   Hyperlipidemia    PCOS (polycystic ovarian syndrome)     Past Surgical History:  Procedure Laterality Date   BREAST BIOPSY     LEFT OOPHORECTOMY     UNILATERAL SALPINGECTOMY     wisdom teeth     WISDOM TOOTH EXTRACTION     XI ROBOT ASSISTED DIAGNOSTIC LAPAROSCOPY Left 05/10/2018   Procedure: DIAGNOSTIC LAPAROSCOPY,OPEN LAPAROTOMY,LEFT OOPHORECTOMY,LEFT SALPINGECTOMY,LEFT ECTOPIC CORNUAL PREGNANCY EXCISION;  Surgeon: Carrington Clamp, MD;  Location: [facility];  Service: Gynecology;  Laterality: Left;  CORNUAL RESECTION OF ECTOPIC PREGNANCY    OB History  Gravida Para Term Preterm AB Living  3       2    SAB IAB Ectopic Multiple Live Births  1   1        # Outcome Date GA Lbr Len/2nd Weight Sex Delivery Anes PTL Lv  3 Current           2 SAB 01/07/20          1 Ectopic 05/09/18            Social History   Socioeconomic History   Marital status: Married    Spouse name: Not on file   Number of children: Not on file   Years of education: Not on file   Highest education level: Not on file  Occupational History   Not on file  Tobacco Use   Smoking status: Never   Smokeless tobacco: Never  Vaping Use   Vaping Use: Not on file  Substance and Sexual  Activity   Alcohol use: Not Currently    Comment: not while preg   Drug use: No   Sexual activity: Yes  Other Topics Concern   Not on file  Social History Narrative   ** Merged History Encounter **       Social Determinants of Health   Financial Resource Strain: Not on file  Food Insecurity: No Food Insecurity (11/09/2022)   Hunger Vital Sign    Worried About Running Out of Food in the Last Year: Never true    Ran Out of Food in the Last Year: Never true  Transportation Needs: Not on file  Physical Activity: Not on file  Stress: Not on file  Social Connections: Not on file  Intimate Partner Violence: Not on file   Coconut (cocos nucifera) and Coconut (cocos nucifera)    Prenatal Transfer Tool  Maternal Diabetes: Yes:  Diabetes Type:  Diet controlled Genetic Screening: Normal--preimplantation genetic screening Maternal Ultrasounds/Referrals: Normal Fetal Ultrasounds or other Referrals:  Fetal echo, Referred to Materal Fetal Medicine  Maternal Substance Abuse:  No Significant Maternal Medications:  Meds include: Other:  nifedipine XL, aspirin Significant Maternal Lab Results: Group B Strep positive  ABO, Rh: --/--/O POS (04/22 0913)  Antibody: NEG (04/22 0913) Rubella: Immune (10/23 0000) RPR: NON REACTIVE (04/22 0906)  HBsAg: Negative (10/23 0000)  HIV: Non-reactive (10/23 0000)  GBS:   Positive   Vitals:   12/27/22 0542  BP: (!) 146/91  Resp: 15  Temp: 98.2 F (36.8 C)     General:  NAD Abdomen:  soft, gravid FHTs:  156    A/P   37 y.o. [id number] [redacted]w[redacted]d presents for primary cesarean section for history of prior wedge resection. Given history of cornual wedge resection entering the endometrial cavity, plan for a pre-labor cesarean delivery due to higher risk of uterine rupture with labor.   Discussed risks of cesarean section to include, but not limited to, infection, bleeding, damage to surrounding strutcures (including bowel, bladder, tubes, ovaries, nerves,  vessels, baby), need for additional procedures, risk of blood clot, need for transfusion. Consent signed Ancef 2gm on call to OR  [facility] GEFFEL Chestine Spore

## 2022-12-27 NOTE — Progress Notes (Signed)
Patient screened out for psychosocial assessment since none of the following apply:  Psychosocial stressors documented in mother or baby's chart  Gestation less than 32 weeks  Code at delivery   Infant with anomalies Please contact the Clinical Social Worker if specific needs arise, by MOB's request, or if MOB scores greater than 9/yes to question 10 on Edinburgh Postpartum Depression Screen.  Shantil Vallejo, LCSW Clinical Social Worker [facility] Cell#: [phone]     

## 2022-12-27 NOTE — Transfer of Care (Signed)
Immediate Anesthesia Transfer of Care Note  Patient: Melissa Liu  Procedure(s) Performed: CESAREAN SECTION  Patient Location: PACU  Anesthesia Type:Spinal  Level of Consciousness: awake  Airway & Oxygen Therapy: Patient Spontanous Breathing  Post-op Assessment: Report given to RN and Post -op Vital signs reviewed and stable  Post vital signs: Reviewed and stable  Last Vitals:  Vitals Value Taken Time  BP 114/82 12/27/22 0915  Temp    Pulse 92 12/27/22 0918  Resp 23 12/27/22 0918  SpO2 100 % 12/27/22 0918  Vitals shown include unvalidated device data.  Last Pain:  Vitals:   12/27/22 0609  TempSrc:   PainSc: 0-No pain         Complications: No notable events documented.

## 2022-12-27 NOTE — Lactation Note (Signed)
This note was copied from a baby's chart.  NICU Lactation Consultation Note  Patient Name: Melissa Liu [id number] Date: 12/27/2022 Age:37  Reason for consult: Initial assessment; Primapara; 1st time breastfeeding; NICU baby; Early term 37-38.6wks; Maternal endocrine disorder; Other (Comment) (IVF pregnancy, AMA) Type of Endocrine Disorder?: Diabetes (GDM)  SUBJECTIVE  LC in to visit with P1 Mom of ET infant in the NICU.  Baby received respiratory support, but is now on room air working on feedings.  Mom desires to breastfeed and start pumping to support her milk supply.    LC set Mom up pumping using 21 mm flanges and instructed on using the Initiation setting on the pump until her milk volume is >20 ml.  Mom encouraged to pump every 2-3 hrs/day and 3-4 hrs at night, as best she can.  Reviewed breast massage and hand expression and encouraged doing this before or after pumping.  Encouraged Mom to do STS with baby when she goes to NICU.  Mom aware of pump in the NICU room for Mom to use.  OBJECTIVE Infant data: Mother's Current Feeding Choice: Breast Milk and Donor Milk  Infant feeding assessment Scale for Readiness: 2 Scale for Quality: 3   Maternal data: [id number]  C-Section, Low Transverse Has patient been taught Hand Expression?: Yes Hand Expression Comments: unable to express colostrum currently Significant Breast History:: ++ breast changes Current breast feeding challenges:: Infant separation Does the patient have breastfeeding experience prior to this delivery?: No Pumping frequency: Encouraged to pump every 2-3 hrs/day and 3-4 hrs/night Flange Size: 21 Risk factor for low milk supply:: Infant separation, AMA, IVF pregnancy, GDM  Pump: Personal, Hands Free  ASSESSMENT Infant: Feeding Status: Scheduled 9-12-3-6  Maternal: No data recorded INTERVENTIONS/PLAN Interventions: Interventions: Breast feeding basics reviewed; Skin to skin; Breast massage;  Hand express; DEBP; Education; [employer] brochure Tools: Pump; Flanges; Hands-free pumping top Pump Education: Setup, frequency, and cleaning; Milk Storage  Plan: Consult Status: NICU follow-up NICU Follow-up type: New admission follow up   Melissa Liu 12/27/2022, 1:00 PM

## 2022-12-27 NOTE — Anesthesia Postprocedure Evaluation (Signed)
Anesthesia Post Note  Patient: Melissa Liu  Procedure(s) Performed: CESAREAN SECTION     Patient location during evaluation: PACU Anesthesia Type: Spinal Level of consciousness: awake Pain management: pain level controlled Vital Signs Assessment: post-procedure vital signs reviewed and stable Respiratory status: spontaneous breathing, nonlabored ventilation and respiratory function stable Cardiovascular status: blood pressure returned to baseline and stable Postop Assessment: no apparent nausea or vomiting Anesthetic complications: no   No notable events documented.  Last Vitals:  Vitals:   12/27/22 1330 12/27/22 1340  BP: 135/84 131/86  Pulse: 99 (!) 103  Resp:    Temp:    SpO2: 99%     Last Pain:  Vitals:   12/27/22 1340  TempSrc:   PainSc: 0-No pain   Pain Goal: Patients Stated Pain Goal: 5 (12/27/22 1002)                 [name] P Ory [place]

## 2022-12-27 NOTE — Anesthesia Procedure Notes (Signed)
Spinal  Patient location during procedure: OR Start time: 12/27/2022 7:36 AM End time: 12/27/2022 7:41 AM Reason for block: surgical anesthesia Staffing Performed: anesthesiologist  Anesthesiologist: Leonides Grills, MD Performed by: Leonides Grills, MD Authorized by: Leonides Grills, MD   Preanesthetic Checklist Completed: patient identified, IV checked, risks and benefits discussed, surgical consent, monitors and equipment checked, pre-op evaluation and timeout performed Spinal Block Patient position: sitting Prep: DuraPrep Patient monitoring: cardiac monitor, continuous pulse ox and blood pressure Approach: midline Location: L4-5 Injection technique: single-shot Needle Needle type: Pencan  Needle gauge: 24 G Needle length: 9 cm Assessment Sensory level: T10 Events: CSF return Additional Notes Functioning IV was confirmed and monitors were applied. Sterile prep and drape, including hand hygiene and sterile gloves were used. The patient was positioned and the spine was prepped. The skin was anesthetized with lidocaine.  Free flow of clear CSF was obtained prior to injecting local anesthetic into the CSF.  The spinal needle aspirated freely following injection.  The needle was carefully withdrawn.  The patient tolerated the procedure well.

## 2022-12-27 NOTE — Op Note (Signed)
Cesarean Section Procedure Note  Pre-operative Diagnosis: 1. Intrauterine pregnancy at [redacted]w[redacted]d  2. Chronic hypertension 3. History of cornual ectopic with wedge resection 4. IVF pregnancy 5. Advanced maternal age  37. Uterine fibroids  Post-operative Diagnosis: same as above  Surgeon: Marlow Baars, MD  Assistants: Derrel Nip, MD  Procedure: Primary low transverse cesarean section   Anesthesia: Spinal anesthesia  Estimated Blood Loss: 1488 mL         Drains: Foley catheter         Specimens: None              Complications:  None; patient tolerated the procedure well.         Disposition: PACU - hemodynamically stable.  Findings:  Fibroid uterus (multiple small fibroids, larges anterior subserosal , 5 cm).  Left tube and ovary surgically absent.  Normal right tube and ovary.  There were multiple arterial bleeders at the hysterotomy that contriubted to the majority of the blood loss.  Once the hysterotomy was closed, there was minimal additional bleeding.  There were moderate adhesions of the rectus muscles and subcutaneous layers, but no significant intra-abdominal adhesions. There was no atony and tone was excellent throughout. Viable female infant, 2900g (6lb 6.3oz) Apgars 5, 9.    Procedure Details   After spinal  anesthesia was found to adequate, the patient was placed in the dorsal supine position with a leftward tilt, prepped and draped in the usual sterile manner. A Pfannenstiel incision was made through the prior scar and carried down through the subcutaneous tissue to the fascia.  The fascia was incised in the midline and the fascial incision was extended laterally with Mayo scissors. The superior aspect of the fascial incision was grasped with two Kocher clamps, tented up and the rectus muscles dissected off sharply. The rectus was then dissected off with blunt dissection and Mayo scissors inferiorly. The rectus muscles were separated in the midline. The abdominal peritoneum  was identified, tented up, entered bluntly, and the incision was extended superiorly and inferiorly with good visualization of the bladder.  There rectus muscles were quite tight, so Bovie cautery used to transect approximately 2 cm of the upper rectus to allow for adquate room for delivery. The Alexis retractor was deployed. The vesicouterine peritoneum was identified and the lower uterine segment clear, so a bladder flap was not created.   A scalpel was then used to make a low transverse incision on the uterus which was extended in the cephalad-caudad direction with blunt dissection. The fluid was clear. The fetal vertex was identified, elevated out of the pelvis and brought to the hysterotomy.  The head was delivered easily followed by the shoulders and body.  After a 30 second delay per protocol, the cord was clamped and cut and the infant was passed to the waiting neonatologist prior to 60 seconds at their request due to poor tone and respiratory effort.  The placenta was then delivered spontaneously, intact and appear normal, the uterus was cleared of all clot and debris   There were multiple larger arterial bleeders at the hysterotomy.  The hysterotomy was repaired with #0 Monocryl in running locked fashion.  AA figure of eight suture was placed at the right of the hysterotomy, and excellent hemostasis was noted.  There were numerous areas of the serosa with mild bleeding.  Bovie cautery alone was insufficient so Arista was placed with improved hemostasis. The Alexis retractor was removed from the abdomen. The peritoneum was examined and all vessels  noted to be hemostatic. The abdominal cavity was cleared of all clot and debris.  The peritoneum was closed with 2-0 vicryl in a running fashion. The fascia and rectus muscles were inspected and were hemostatic. The fascia was closed with 0 Vicryl in a running fashion. The subcutaneous layer was irrigated and all bleeders cauterized. The subcutaneous layer was  closed with interrupted plain gut. The skin was closed with 4-0 vicryl in a subcuticular fashion. The incision was dressed with benzoine, steri strips and honeycomb dressing. All sponge lap and needle counts were correct x3. Patient tolerated the procedure well and recovered in stable condition following the procedure.   An experienced assistant was required given the standard of surgical care given the complexity of the case.  This assistant was needed for exposure, dissection, suctioning, retraction, instrument exchange, assisting with delivery with administration of fundal pressure, and for overall help during the procedure.

## 2022-12-28 LAB — CBC
HCT: 22.1 % — ABNORMAL LOW (ref 36.0–46.0)
Hemoglobin: 7.5 g/dL — ABNORMAL LOW (ref 12.0–15.0)
MCH: 30.6 pg (ref 26.0–34.0)
MCHC: 33.9 g/dL (ref 30.0–36.0)
MCV: 90.2 fL (ref 80.0–100.0)
Platelets: 131 10*3/uL — ABNORMAL LOW (ref 150–400)
RBC: 2.45 MIL/uL — ABNORMAL LOW (ref 3.87–5.11)
RDW: 13.1 % (ref 11.5–15.5)
WBC: 7.7 10*3/uL (ref 4.0–10.5)
nRBC: 0 % (ref 0.0–0.2)

## 2022-12-28 LAB — GLUCOSE, CAPILLARY: Glucose-Capillary: 83 mg/dL (ref 70–99)

## 2022-12-28 MED ORDER — SODIUM CHLORIDE 0.9 % IV SOLN: INTRAVENOUS | Status: DC

## 2022-12-28 MED ORDER — POLYSACCHARIDE IRON COMPLEX 150 MG PO CAPS
150.0000 mg | ORAL_CAPSULE | Freq: Every day | ORAL | Status: DC
  Administered 2022-12-29 – 2022-12-30 (×2): 150 mg via ORAL
  Filled 2022-12-28 (×2): qty 1

## 2022-12-28 NOTE — Lactation Note (Signed)
This note was copied from a baby's chart.  NICU Lactation Consultation Note  Patient Name: Melissa Liu ZOXWR'U Date: 12/28/2022 Age:39 hours  Reason for consult: Follow-up assessment; NICU baby; 1st time breastfeeding; Primapara; Early term 37-38.6wks; Maternal endocrine disorder Type of Endocrine Disorder?: Diabetes  SUBJECTIVE  LC in to visit with P1 Mom of ET infant in the NICU.  Baby may transition back to Mom today as she is doing great and taking her bottles ad lib.  Mom states she has been trying to be consistently pumping every 3 hrs,  but has been sleeping some.  Mom is doing hand expression as well and is seeing drops.  Mom encouraged to pump after breakfast and then if she goes to NICU, to put baby STS and if baby is showing feeding cues, ask her RN to call [facility] for assistance.   OBJECTIVE Infant data: Mother's Current Feeding Choice: Breast Milk and Donor Milk  Infant feeding assessment Scale for Readiness: 1 Scale for Quality: 3   Maternal data: [id number]  C-Section, Low Transverse Has patient been taught Hand Expression?: Yes Hand Expression Comments: unable to express colostrum currently Significant Breast History:: ++ breast changes Current breast feeding challenges:: Infant separation Does the patient have breastfeeding experience prior to this delivery?: No Pumping frequency: Every 3 hrs when awake Pumped volume: 0 mL (drops) Flange Size: 21 Risk factor for low milk supply:: Infant separation, AMA, IVF pregnancy, GDM  Pump: Employee Pump, Personal  ASSESSMENT Infant: Feeding Status: Ad lib  Maternal: Milk volume: Normal  INTERVENTIONS/PLAN Interventions: Interventions: Breast feeding basics reviewed; Skin to skin; Breast massage; Hand express; DEBP Tools: Pump; Flanges Pump Education: Setup, frequency, and cleaning; Milk Storage  Plan: Consult Status: NICU follow-up NICU Follow-up type: Verify onset of copious milk; Verify absence of  engorgement   Melissa Liu 12/28/2022, 9:45 AM

## 2022-12-28 NOTE — Lactation Note (Addendum)
This note was copied from a baby's chart.  NICU Lactation Consultation Note  Patient Name: Melissa Liu ZOXWR'U Date: 12/28/2022 Age:39 hours  Reason for consult: Follow-up assessment; Breastfeeding assistance; Mother's request; NICU baby; Early term 20-38.6wks; Maternal endocrine disorder Type of Endocrine Disorder?: Diabetes  SUBJECTIVE  Mom in baby's room in the NICU wanting to offer the breast for first time.  Baby cueing and latched easily with LC assistance.  Baby mouth wide and latched onto areola with flanged lips.  Baby consistently sucking with deep jaw extensions.  Mom has easy flow of colostrum and baby noted to be sucking with jaw extensions and latched to areola, no audible and noticeable swallows at present.  Baby fed for 6 mins and seemed contented after breastfeeding while lying STS on Mom's chest.  Mom wondering about supplementation type and pumping.    LC suggested she offer the breast with feeding cues, or at least after 3 hrs.  If baby feeds > 10 mins and is contented acting, Mom does not need to supplement.  If baby isn't latching and/or fussy after a feeding, baby can be supplemented with donor breast milk/formula and Mom should pump to support her milk supply.  LC educated Mom that anytime baby is fed other than on the breast, Mom should pump 15 mins.  Baby being transferred back to Mom's room on OBSC.  OBJECTIVE Infant data: Mother's Current Feeding Choice: Breast Milk and Donor Milk  Infant feeding assessment Scale for Readiness: 1 Scale for Quality: 3   Maternal data: [id number]  C-Section, Low Transverse Has patient been taught Hand Expression?: Yes Hand Expression Comments: unable to express colostrum currently Significant Breast History:: ++ breast changes Current breast feeding challenges:: Infant separation Does the patient have breastfeeding experience prior to this delivery?: No Pumping frequency: Every 3 hrs when awake Pumped volume:  0 mL (drops) Flange Size: 21 Risk factor for low milk supply:: Infant separation, AMA, IVF pregnancy, GDM  Pump: Hands Free, Personal  ASSESSMENT Infant: LATCH Documentation Latch: 2 Audible Swallowing: 0 Type of Nipple: 2 Comfort (Breast/Nipple): 2 Hold (Positioning): 1 LATCH Score: 7  Feeding Status: Ad lib  Maternal: Milk volume: Normal  INTERVENTIONS/PLAN Interventions: Interventions: Breast feeding basics reviewed; Assisted with latch; Skin to skin; Breast massage; Hand express; Breast compression; Adjust position; Support pillows; Position options; DEBP; Pace feeding; Education Tools: Pump; Flanges; Bottle Pump Education: Setup, frequency, and cleaning; Milk Storage  Plan: Consult Status: Follow-up NICU Follow-up type: Verify onset of copious milk; Verify absence of engorgement   [name] 12/28/2022, 11:14 AM

## 2022-12-28 NOTE — Progress Notes (Signed)
Subjective: Postpartum Day #1: Cesarean Delivery Patient reports incisional pain, tolerating PO, and no problems voiding.    Objective: Vital signs in last 24 hours: Temp:  [97.6 F (36.4 C)-98 F (36.7 C)] 97.6 F (36.4 C) (04/24 0618) Pulse Rate:  [77-114] 94 (04/24 0618) Resp:  [16-19] 18 (04/24 0618) BP: (112-135)/(71-92) 112/71 (04/24 0618) SpO2:  [98 %-100 %] 100 % (04/24 0618)  Physical Exam:  General: alert Lochia: appropriate Uterine Fundus: firm Incision: dressing C/D/I  Recent Labs    12/26/22 0906 12/28/22 0419  HGB 12.4 7.5*  HCT 38.1 22.1*    Assessment/Plan: Status post Cesarean section. Doing well postoperatively. Acute blood loss anemia-stable Continue current care, ambulate, hydrate.  Zenaida Niece, MD 12/28/2022, 9:15 AM

## 2022-12-29 LAB — GLUCOSE, CAPILLARY: Glucose-Capillary: 76 mg/dL (ref 70–99)

## 2022-12-29 MED ORDER — SODIUM CHLORIDE 0.9 % IV BOLUS: 500.0000 mL | Freq: Once | INTRAVENOUS | Status: DC | PRN

## 2022-12-29 MED ORDER — METHYLPREDNISOLONE SODIUM SUCC 125 MG IJ SOLR: 125.0000 mg | Freq: Once | INTRAMUSCULAR | Status: DC | PRN

## 2022-12-29 MED ORDER — DIPHENHYDRAMINE HCL 50 MG/ML IJ SOLN: 25.0000 mg | Freq: Once | INTRAMUSCULAR | Status: DC | PRN

## 2022-12-29 MED ORDER — IRON SUCROSE 500 MG IVPB - SIMPLE MED: 500.0000 mg | Freq: Once | INTRAVENOUS | Status: DC

## 2022-12-29 MED ORDER — SODIUM CHLORIDE 0.9 % IV SOLN: INTRAVENOUS | Status: DC | PRN

## 2022-12-29 MED ORDER — SODIUM CHLORIDE 0.9 % IV SOLN
500.0000 mg | Freq: Once | INTRAVENOUS | Status: AC
  Administered 2022-12-29: 500 mg via INTRAVENOUS
  Filled 2022-12-29: qty 25

## 2022-12-29 MED ORDER — IRON SUCROSE 500 MG IVPB - SIMPLE MED
500.0000 mg | Freq: Once | INTRAVENOUS | Status: DC
  Filled 2022-12-29: qty 275

## 2022-12-29 MED ORDER — EPINEPHRINE PF 1 MG/ML IJ SOLN: 0.3000 mg | Freq: Once | INTRAMUSCULAR | Status: DC | PRN

## 2022-12-29 MED ORDER — ALBUTEROL SULFATE (2.5 MG/3ML) 0.083% IN NEBU
2.5000 mg | INHALATION_SOLUTION | Freq: Once | RESPIRATORY_TRACT | Status: DC | PRN
  Filled 2022-12-29: qty 3

## 2022-12-29 NOTE — Progress Notes (Signed)
Patient is doing well.  She is tolerating PO, ambulating, voiding.  Pain is controlled.  Lochia is appropriate  Vitals:   12/29/22 0425 12/29/22 0745 12/29/22 1030 12/29/22 1045  BP: 119/74 122/78 116/80 112/66  Pulse: 87 92 95 90  Resp: Temp: 98.3 F (36.8 C)  97.7 F (36.5 C) 98.1 F (36.7 C)  TempSrc: Oral  Oral Oral  SpO2: 100%  98% 100%  Weight:      Height:        NAD Abdomen:  soft, appropriate tenderness, incisions intact and without erythema or drainage ext:    Symmetric, 1+ edema bilaterally  Lab Results  Component Value Date   WBC 7.7 12/28/2022   HGB 7.5 (L) 12/28/2022   HCT 22.1 (L) 12/28/2022   MCV 90.2 12/28/2022   PLT 131 (L) 12/28/2022    --/--/O POS Performed at [facility], [address], [place], [place] [address]  (04/23 0600)/  A/P    37 y.o. [name] POD #2 s/p primary cesarean delivery at 37 weeks due to history of cornual wedge resection Routine post op and postpartum care.  Suboptimal pain control yesterday (did not want to take oxycodone).  Recommend continuing with scheduled ibuprofen and tylenol, try scheduled oxy in between today.  If still no improvement, can switch to PO dilaudid CHTN: BPs at goal on home procardia Xl  daily GDMA1: fastings at goal (hypoglycemic day of surgery).  Plan 2hr gtt postpartum ABLA: EBL 1400 at time of surgery.  Is asymptomatic, but hgb 7.5 yesterday.  Will plan IV iron today Anticipate discharge tomorrow--baby just out of NICU last night

## 2022-12-29 NOTE — Lactation Note (Signed)
This note was copied from a baby's chart. Lactation Consultation Note  Patient Name: Melissa Liu ZOXWR'U Date: 12/29/2022 Age:39 hours  Reason for consult: Follow-up assessment;Early term 37-38.6wks;Maternal endocrine disorder;Infant < 6lbs;Primapara;1st time breastfeeding  P1, GA [redacted]w[redacted]d, IVF, GDM, infant transferred from NICU to mother's room, AMA  Mother in good spirits upon arrival to room. Family is pleased that baby is feeding well with bottle and tolerating donor breast milk. Mother states she attempted to breastfeed today but "she does not feel she latched well enough to get anything". Mother pumped and expressed a few ml and parents added mother's milk into the donor milk. Infant took all of the milk in her bottle. Discussed feeding her breast milk to baby first to make sure she gets all of her mother's milk. Follow her expressed milk with donor milk. Praised parents for their feeding effort and commitment.   Encouraged to keep pumping every 3 hours (8 times /24 hrs) and allow baby time at the breast so she will be familiar when mother's milk comes to volume. And continue to supplement feedings.    Maternal Data Has patient been taught Hand Expression?: Yes Does the patient have breastfeeding experience prior to this delivery?: No  Feeding Mother's Current Feeding Choice: Breast Milk and Donor Milk Nipple Type: Dr. Lorne Skeens  [facility] Score  Not observed    Lactation Tools Discussed/Used Tools: Flanges;Pump;Bottle Flange Size: 21 Reason for Pumping: stimulate milk production, supplement Pumping frequency: every 3 hrs for 15 min Pumped volume: 3 mL  Interventions Interventions: Education  Discharge Pump: Hands Free;Personal  Consult Status Consult Status: Follow-up Date: 12/30/22 Follow-up type: In-patient    Christella Hartigan M 12/29/2022, 6:18 PM

## 2022-12-30 MED ORDER — OXYCODONE-ACETAMINOPHEN 5-325 MG PO TABS: 1.0000 | ORAL_TABLET | ORAL | 0 refills | Status: AC | PRN

## 2022-12-30 MED ORDER — IBUPROFEN 600 MG PO TABS: 600.0000 mg | ORAL_TABLET | Freq: Four times a day (QID) | ORAL | 1 refills | Status: AC | PRN

## 2022-12-30 NOTE — Discharge Summary (Signed)
Postpartum Discharge Summary  Date of Service updated      Patient Name: Melissa Liu DOB: 01/07/1986 MRN: [id number]  Date of admission: 12/27/2022 Delivery date:12/27/2022  Delivering provider: Marlow Baars  Date of discharge: 12/30/2022  Admitting diagnosis: Encounter for supervision of normal first pregnancy in third trimester [Z34.03] Benign essential hypertension antepartum in third trimester [O10.013] Intrauterine pregnancy: [redacted]wMelissa Liud     Secondary diagnosis:  Principal Problem:   Encounter for supervision of normal first pregnancy in third trimester Active Problems:   Benign essential hypertension antepartum in third trimester  Additional problems: Anemia of acute blood loss; GDMA1    Discharge diagnosis: Term Pregnancy Delivered, CHTN, GDM A1, and Anemia                                              Post partum procedures: iron infusion  Augmentation: N/A Complications: None  Hospital course: Sceduled C/S   37 y.o. yo [name] at [redacted]wMelissa Liud was admitted to the hospital 12/27/2022 for scheduled cesarean section with the following indication:Elective Primary.Delivery details are as follows:  Membrane Rupture Time/Date:  ,12/27/2022   Delivery Method:C-Section, Low Transverse  Details of operation can be found in separate operative note.  Patient had a postpartum course complicated byanemia of acute blood loss .  She is ambulating, tolerating a regular diet, passing flatus, and urinating well. Patient is discharged home in stable condition on  12/30/22        Newborn Data: Birth date:12/27/2022  Birth time:8:04 AM  Gender:Female  Living status:Living  Apgars:5 ,9  Weight:2900 g     Magnesium Sulfate received: No BMZ received: No  Physical exam  Vitals:   12/29/22 1939 12/30/22 0015 12/30/22 0554 12/30/22 0836  BP: 129/81 125/81 123/79 126/79  Pulse: (!) 101 99 93 97  Resp: 17 18 18 18   Temp: 98.8 F (37.1 C) 99 F (37.2 C) 97.9 F (36.6 C) 98.1 F (36.7 C)   TempSrc: Oral Oral Oral Oral  SpO2: 99%  98% 99%  Weight:      Height:       General: alert, cooperative, and no distress Lochia: appropriate Uterine Fundus: firm Incision: Dressing is clean, dry, and intact DVT Evaluation: No evidence of DVT seen on physical exam. Labs: Lab Results  Component Value Date   WBC 7.7 12/28/2022   HGB 7.5 (L) 12/28/2022   HCT 22.1 (L) 12/28/2022   MCV 90.2 12/28/2022   PLT 131 (L) 12/28/2022      Latest Ref Rng & Units 05/11/2018    4:42 AM  CMP  Glucose 70 - 99 mg/dL 956   BUN 6 - 20 mg/dL 8   Creatinine 2.13 - 0.86 mg/dL 5.78   Sodium 469 - 629 mmol/L 139   Potassium 3.5 - 5.1 mmol/L 4.0   Chloride 98 - 111 mmol/L 110   CO2 22 - 32 mmol/L 24   Calcium 8.9 - 10.3 mg/dL 8.5   Total Protein 6.5 - 8.1 g/dL 5.3   Total Bilirubin 0.3 - 1.2 mg/dL 0.4   Alkaline Phos 38 - 126 U/L 34   AST 15 - 41 U/L 26   ALT 0 - 44 U/L 16    Edinburgh Score:    12/27/2022   11:19 AM  Edinburgh Postnatal Depression Scale Screening Tool  I have been able to laugh and see the  funny side of things. 0  I have looked forward with enjoyment to things. 0  I have blamed myself unnecessarily when things went wrong. 0  I have been anxious or worried for no good reason. 1  I have felt scared or panicky for no good reason. 0  Things have been getting on top of me. 0  I have been so unhappy that I have had difficulty sleeping. 0  I have felt sad or miserable. 0  I have been so unhappy that I have been crying. 0  The thought of harming myself has occurred to me. 0  Edinburgh Postnatal Depression Scale Total 1      After visit meds:  Allergies as of 12/30/2022       Reactions   Coconut (cocos Nucifera) Rash   Coconut (cocos Nucifera) Itching, Rash        Medication List     STOP taking these medications    aspirin EC 81 MG tablet   freestyle lancets   FREESTYLE LITE test strip Generic drug: glucose blood   FreeStyle Lite w/Device Kit        TAKE these medications    cholecalciferol 25 MCG (1000 UNIT) tablet Commonly known as: VITAMIN D3 Take 1,000 Units by mouth daily.   ferrous sulfate 325 (65 FE) MG tablet Take 325 mg by mouth daily with breakfast.   folic acid 400 MCG tablet Commonly known as: FOLVITE Take 400 mcg by mouth daily.   ibuprofen 600 MG tablet Commonly known as: ADVIL Take 1 tablet (600 mg total) by mouth every 6 (six) hours as needed for moderate pain or cramping.   NIFEdipine 30 MG 24 hr tablet Commonly known as: Procardia XL Take 1 tablet (30 mg total) by mouth daily.   oxyCODONE-acetaminophen 5-325 MG tablet Commonly known as: Percocet Take 1 tablet by mouth every 4 (four) hours as needed for up to 7 days.   prenatal multivitamin Tabs tablet Take 1 tablet by mouth daily.         Discharge home in stable condition Infant Feeding: Breast Infant Disposition:home with mother Discharge instruction: per After Visit Summary and Postpartum booklet. Activity: Advance as tolerated. Pelvic rest for 6 weeks.  Diet: carb modified diet and low salt diet Anticipated Birth Control: Unsure Postpartum Appointment:6 weeks Additional Postpartum F/U: Postpartum Depression checkup, Incision check 2weeks, and BP check 2 weeks Future Appointments:No future appointments. Follow up Visit:  Follow-up Information     Marlow Baars, MD Follow up in 2 week(s).   Specialty: Obstetrics Contact information: [address] [place] [address] [phone]                     12/30/2022 Cathrine Muster, DO

## 2022-12-30 NOTE — Lactation Note (Signed)
This note was copied from a baby's chart. Lactation Consultation Note  Patient Name: Melissa Liu ZOXWR'U Date: 12/30/2022 Age:39 hours  Reason for consult: Follow-up assessment;Early term 37-38.6wks;Infant weight loss  Mother and baby going home today. Mother pleased that she was able to express 3 ml of milk and fed to baby. Mother will be transitioning from [facility] to formula. She will continue to pump and give her EBM. Discussed pumping every 3 hours for 15 minutes (8 times/24 hrs) for the best outcome of getting milk supply established and discussed the benefits of putting baby to breast with feeding cues. Recommended OP lactation appointment for support and assessment of breastfeeding. Mom made aware of O/P services, breastfeeding support groups, community resources, and our phone # for post-discharge questions. Mother will call, if desires.   Mother is good spirits and baby feeding well with bottle.     Feeding Mother's Current Feeding Choice: Breast Milk and Donor Milk Nipple Type: Dr. Lorne Skeens   Lactation Tools Discussed/Used Tools: Bottle;Pump;Flanges Flange Size: 21 Breast pump type: Double-Electric Breast Pump Pump Education: Milk Storage Reason for Pumping: stimulate milk supply Pumped volume: 3 mL  Interventions Interventions: Education  Discharge Discharge Education: Engorgement and breast care;Warning signs for feeding baby;Other (comment) (informed of OP LC follow up appointment, mother will call)  Consult Status Consult Status: Complete Date: 12/30/22    Omar Person 12/30/2022, 2:49 PM

## 2022-12-30 NOTE — Progress Notes (Addendum)
Subjective: Postpartum Day 3: Cesarean Delivery Patient reports incisional pain, tolerating PO, + flatus, and no problems voiding.  She specifically denies HA, SOB, visual changes or lightheadedness. She is bonding well with baby - breastfeeding. She feels ready for discharge to home today  Objective: Vital signs in last 24 hours: Temp:  [97.7 F (36.5 C)-99 F (37.2 C)] 98.1 F (36.7 C) (04/26 0836) Pulse Rate:  [90-101] 97 (04/26 0836) Resp:  [16-18] 18 (04/26 0836) BP: (112-129)/(66-82) 126/79 (04/26 0836) SpO2:  [97 %-100 %] 99 % (04/26 0836)  Physical Exam:  General: alert, cooperative, and no distress Lochia: appropriate Uterine Fundus: firm Incision: no significant drainage DVT Evaluation: No evidence of DVT seen on physical exam. Calf/Ankle edema is present.  Recent Labs    12/28/22 0419  HGB 7.5*  HCT 22.1*    Assessment/Plan: Status post Cesarean section. Doing well postoperatively.  Discharged home with standard precautions  Plan BP and incision check in two weeks per Primary OBGYN All questions answered    Cathrine Muster, DO 12/30/2022, 10:06 AM

## 2022-12-30 NOTE — Progress Notes (Signed)
Discharge instructions: PP care, Pre-eclampsia s/s, when to call MD or return to MAU, infant care, f/u appointments and medication. given and patient verbalizes understanding.

## 2022-12-30 NOTE — Discharge Instructions (Signed)
Call office with any concerns [phone] 

## 2023-01-05 ENCOUNTER — Telehealth (HOSPITAL_COMMUNITY): Payer: Self-pay

## 2023-01-05 NOTE — Telephone Encounter (Signed)
Patient reports that she is doing really good. Patient has questions about the duration of bleeding. RN reviewed normal lochia color, amounts, and duration of bleeding. RN also reviewed what to much bleeding looks like and when to call the provider. Patient states that she had a headache but that it is gone now. She states that her BP has been good and she has been taking her medication. RN reviewed preeclampsia and warning signs with patient. Patient declines having any preeclampsia warning signs. She states that she had some constipation and then diarrhea yesterday. She reports that she is feeling better today. Patient is hydrating and eating a light diet today. RN told patient to reach out to her OB if she continues to have any issues with constipation or diarrhea. Patient has an appointment with her OB next week. Patient declines any other questions/concerns about her health and healing.  Patient reports that baby is doing great. Patient states that her milk is coming in and baby is feeding well. Baby sleeps in a bassinet or crib. RN reviewed ABC's of safe sleep with patient. Patient declines any questions or concerns about baby.  EPDS score is 0.  [facility]  Perinatal Services   01/05/23,1916

## 2023-01-16 ENCOUNTER — Other Ambulatory Visit (HOSPITAL_COMMUNITY): Payer: Self-pay

## 2023-01-16 MED ORDER — NIFEDIPINE ER OSMOTIC RELEASE 30 MG PO TB24
30.0000 mg | ORAL_TABLET | Freq: Every day | ORAL | 0 refills | Status: DC
  Filled 2023-01-16: qty 90, 90d supply, fill #0

## 2023-01-17 ENCOUNTER — Other Ambulatory Visit (HOSPITAL_COMMUNITY): Payer: Self-pay

## 2023-01-26 DIAGNOSIS — Z1331 Encounter for screening for depression: Secondary | ICD-10-CM | POA: Diagnosis not present

## 2023-03-11 DIAGNOSIS — H5213 Myopia, bilateral: Secondary | ICD-10-CM | POA: Diagnosis not present

## 2023-03-21 ENCOUNTER — Other Ambulatory Visit (HOSPITAL_COMMUNITY): Payer: Self-pay

## 2023-03-21 MED ORDER — NIFEDIPINE ER OSMOTIC RELEASE 30 MG PO TB24
30.0000 mg | ORAL_TABLET | Freq: Every day | ORAL | 1 refills | Status: AC
  Filled 2023-03-21 – 2023-08-22 (×2): qty 90, 90d supply, fill #0
  Filled 2023-11-10: qty 90, 90d supply, fill #1

## 2023-03-22 ENCOUNTER — Other Ambulatory Visit (HOSPITAL_COMMUNITY): Payer: Self-pay

## 2023-04-03 DIAGNOSIS — R0602 Shortness of breath: Secondary | ICD-10-CM | POA: Diagnosis not present

## 2023-04-03 DIAGNOSIS — R000 Tachycardia, unspecified: Secondary | ICD-10-CM | POA: Diagnosis not present

## 2023-04-15 ENCOUNTER — Other Ambulatory Visit (HOSPITAL_COMMUNITY): Payer: Self-pay

## 2023-04-15 DIAGNOSIS — E7800 Pure hypercholesterolemia, unspecified: Secondary | ICD-10-CM | POA: Diagnosis not present

## 2023-04-15 DIAGNOSIS — D539 Nutritional anemia, unspecified: Secondary | ICD-10-CM | POA: Diagnosis not present

## 2023-04-15 DIAGNOSIS — I10 Essential (primary) hypertension: Secondary | ICD-10-CM | POA: Diagnosis not present

## 2023-04-15 MED ORDER — NIFEDIPINE ER OSMOTIC RELEASE 30 MG PO TB24
30.0000 mg | ORAL_TABLET | Freq: Every day | ORAL | 5 refills | Status: AC
  Filled 2023-04-15: qty 30, 30d supply, fill #0
  Filled 2023-05-27: qty 30, 30d supply, fill #1
  Filled 2023-06-23: qty 30, 30d supply, fill #2
  Filled 2023-07-23: qty 30, 30d supply, fill #3
  Filled 2024-03-15: qty 30, 30d supply, fill #4

## 2023-04-17 ENCOUNTER — Other Ambulatory Visit (HOSPITAL_COMMUNITY): Payer: Self-pay

## 2023-05-29 ENCOUNTER — Other Ambulatory Visit (HOSPITAL_COMMUNITY): Payer: Self-pay

## 2023-06-19 IMAGING — MG MM BREAST LOCALIZATION CLIP
4 series · 4 of 12 positions shown · non-contrast
Comparison: Previous exam(s).

CLINICAL DATA: Evaluate RIBBON biopsy clip placement following
ultrasound-guided LEFT breast biopsy.

EXAM:
3D DIAGNOSTIC LEFT MAMMOGRAM POST ULTRASOUND BIOPSY

[L ML synth-2D]
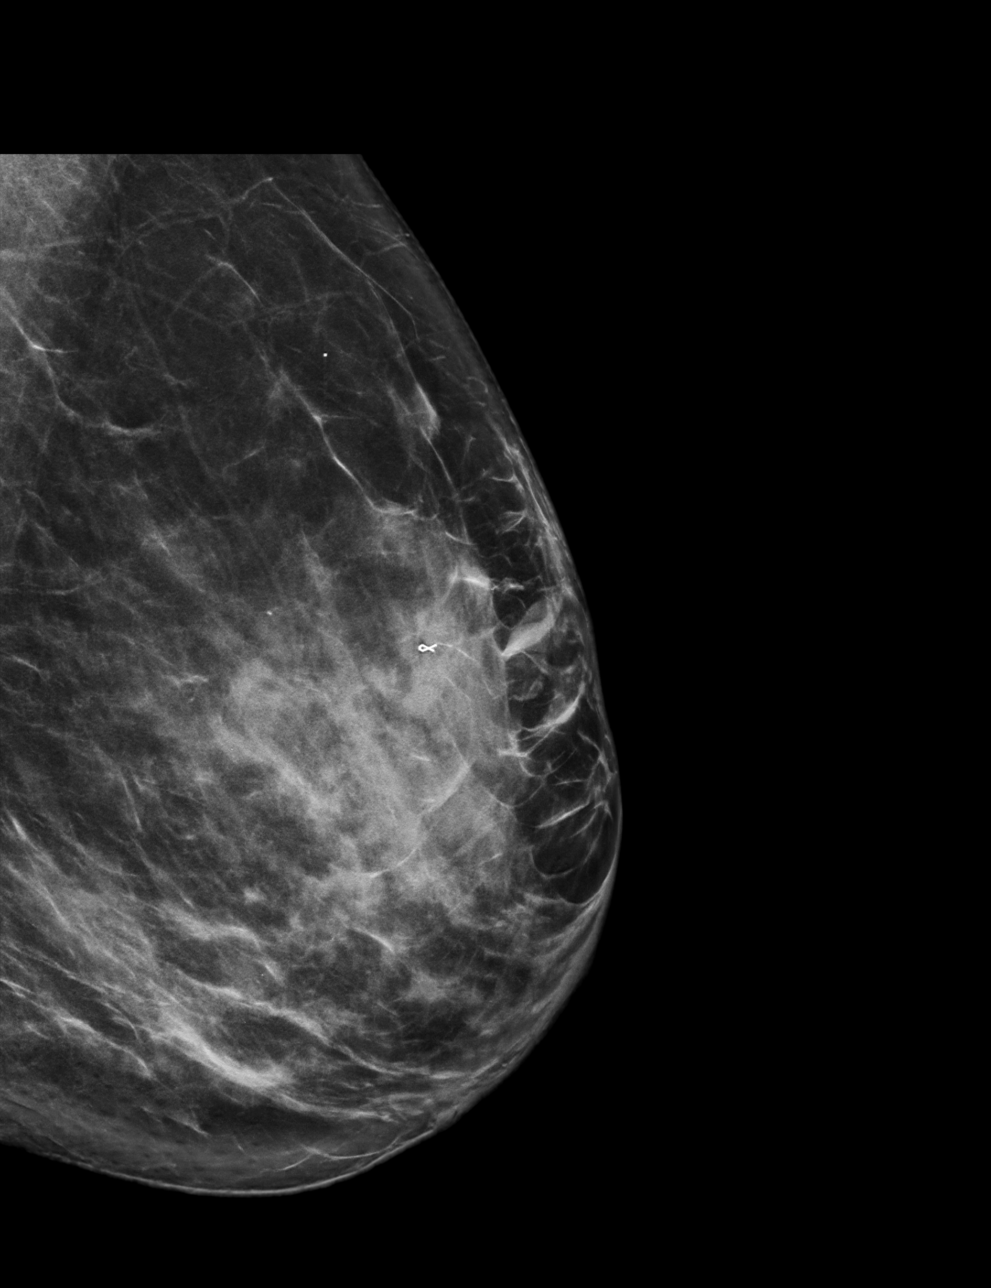

[L CC synth-2D]
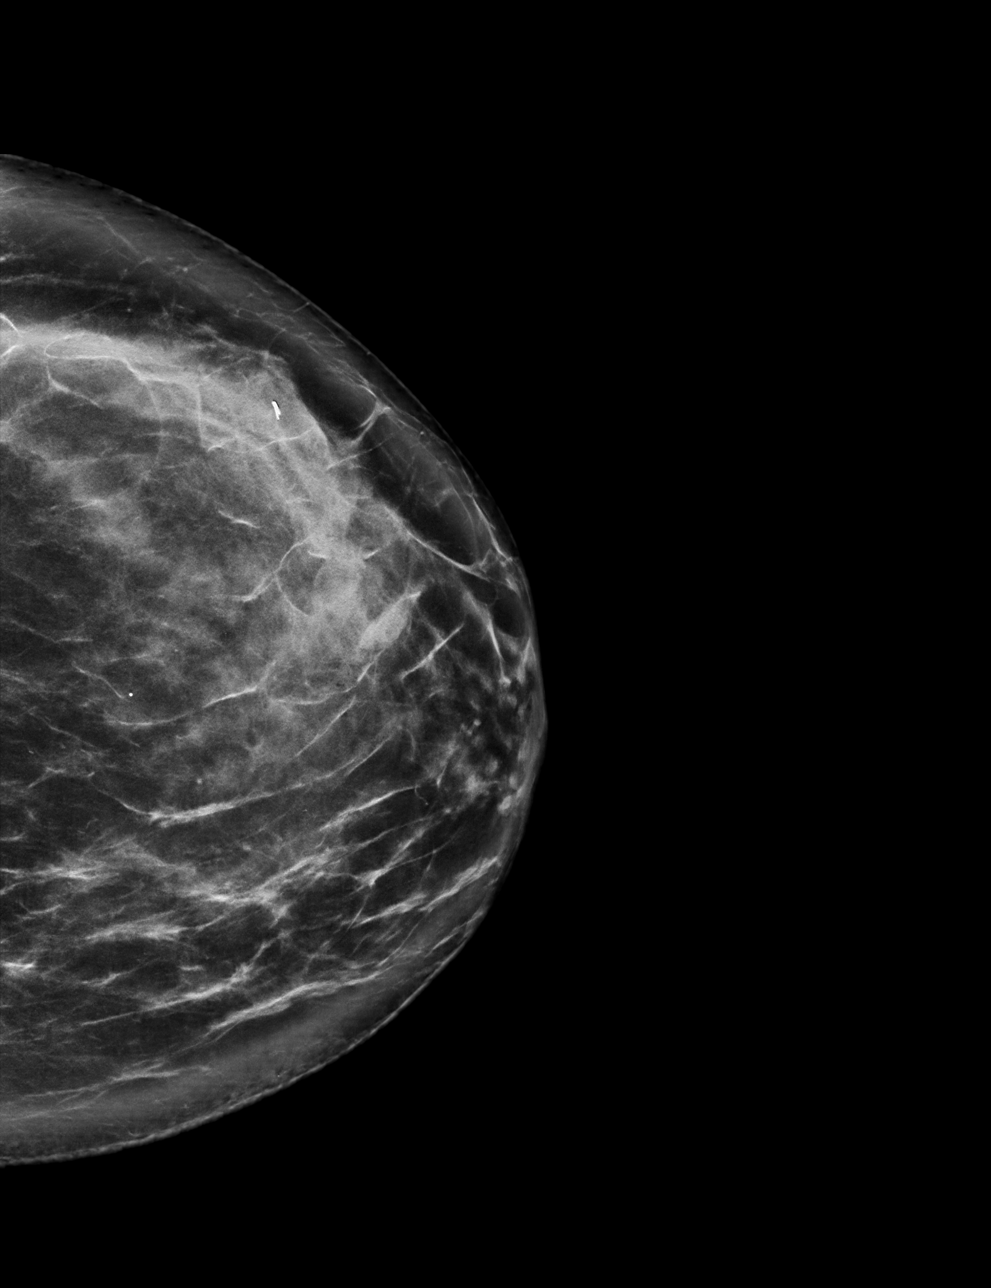

[L ML tomo · tomo slice 39/78.0]
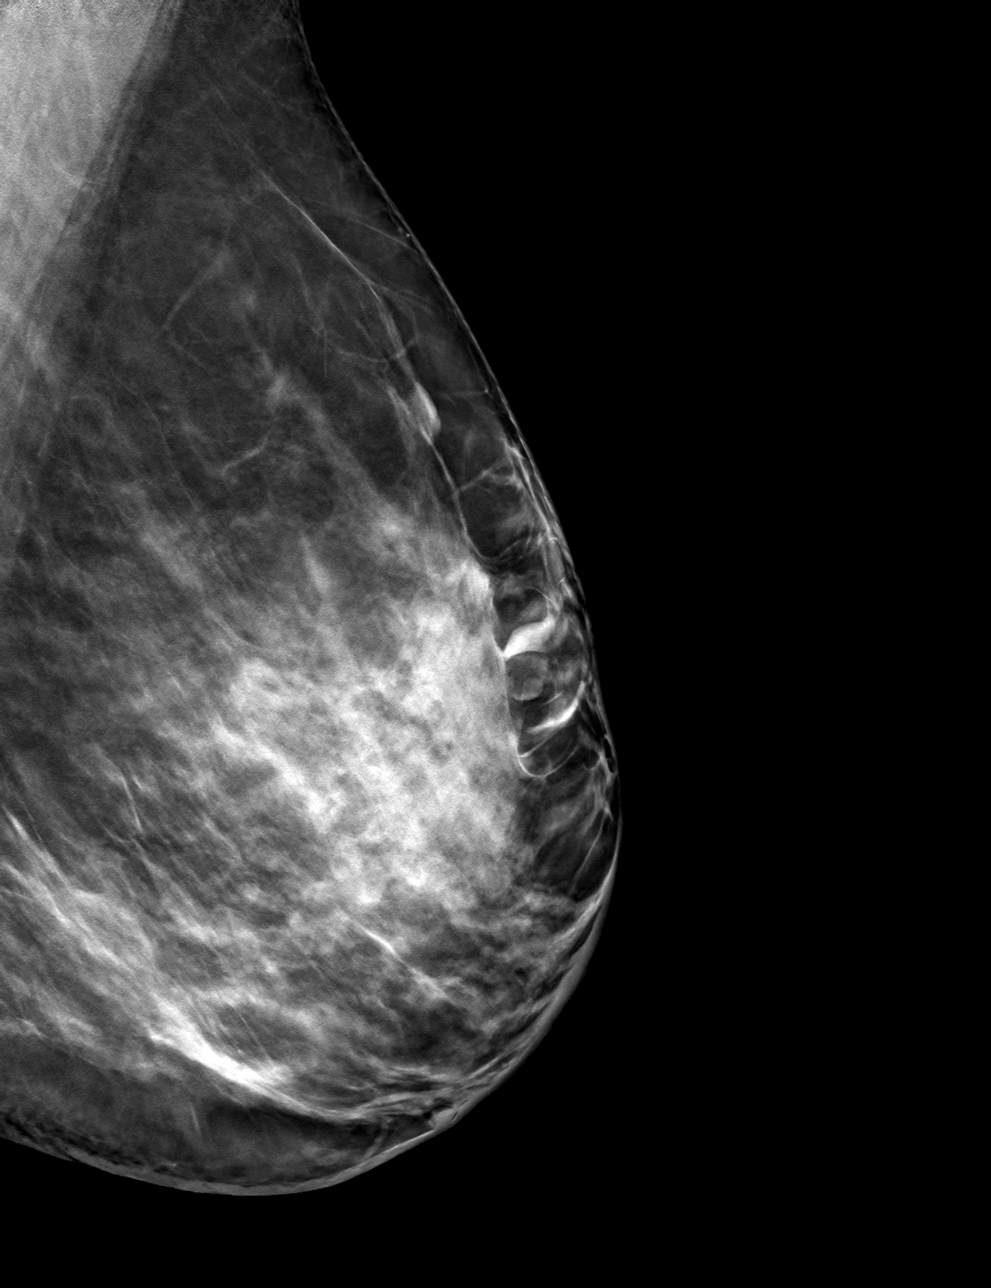

[L CC tomo · tomo slice 43/86.0]
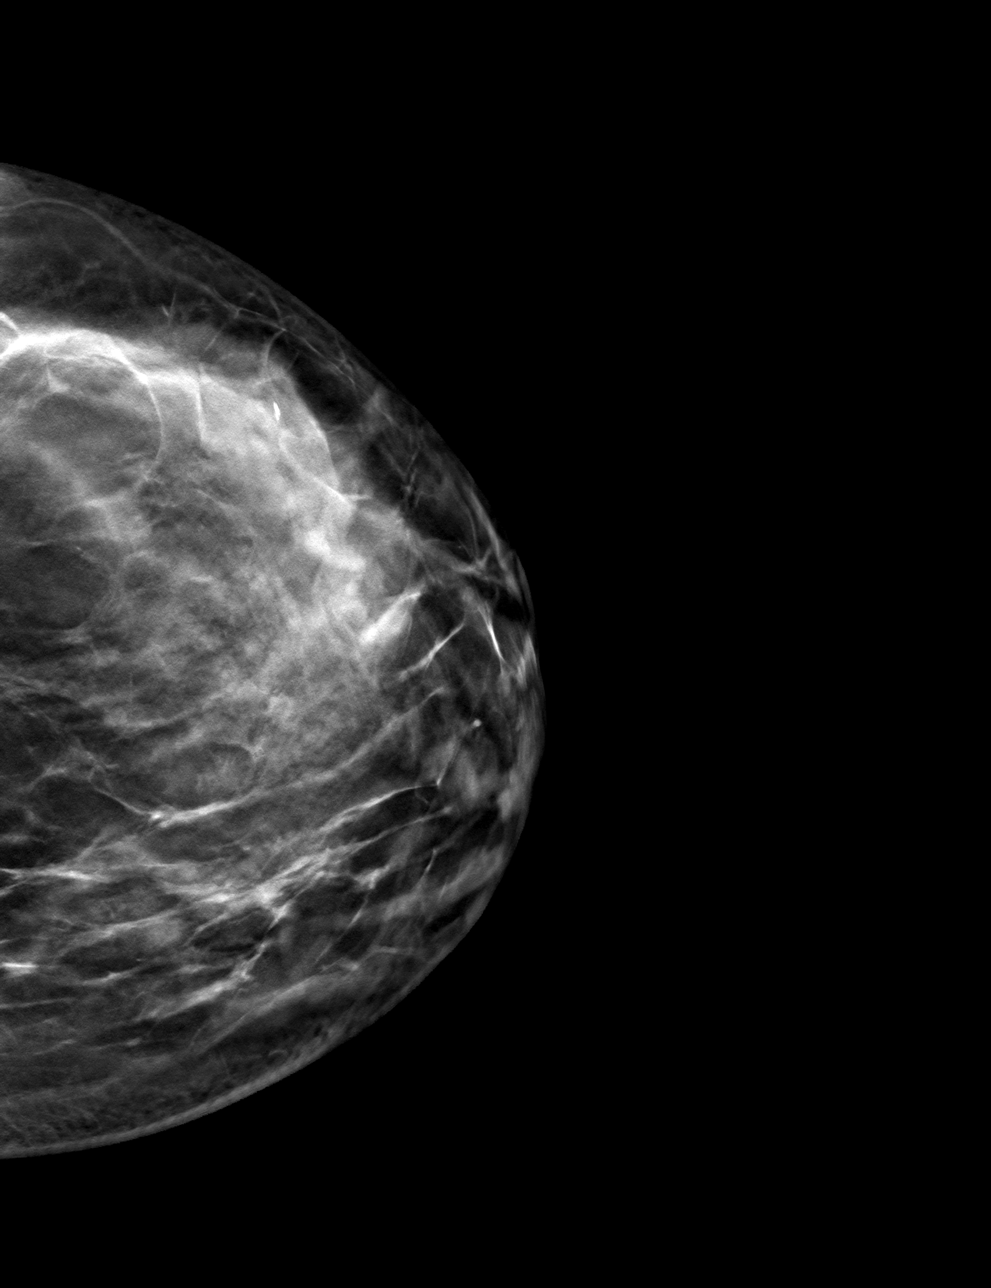

[4 of 12 positions shown; findings below may reference images not displayed]

FINDINGS: 3D Mammographic images were obtained following ultrasound guided
biopsy of the 1 cm UPPER-OUTER LEFT breast mass. The biopsy marking
clip is in expected position at the site of biopsy.
IMPRESSION: Appropriate positioning of the RIBBON shaped biopsy marking clip at
the site of biopsy in the UPPER OUTER LEFT breast.

Final Assessment: Post Procedure Mammograms for Marker Placement

## 2023-06-19 IMAGING — US US BREAST BX W LOC DEV 1ST LESION IMG BX SPEC US GUIDE*L*
1 series · 12 of 13 positions shown · non-contrast
Comparison: None Available.
COMPARISON: None Available.

Addendum:
CLINICAL DATA: 36-year-old female for tissue sampling of 1 cm
UPPER-OUTER LEFT breast mass.

EXAM:
ULTRASOUND GUIDED LEFT BREAST CORE NEEDLE BIOPSY

[Series 1: us breast bx w loc dev 1st lesion img bx spec us g · 0.06mm/px · 12 of 13 slices shown]
[im 1/13]
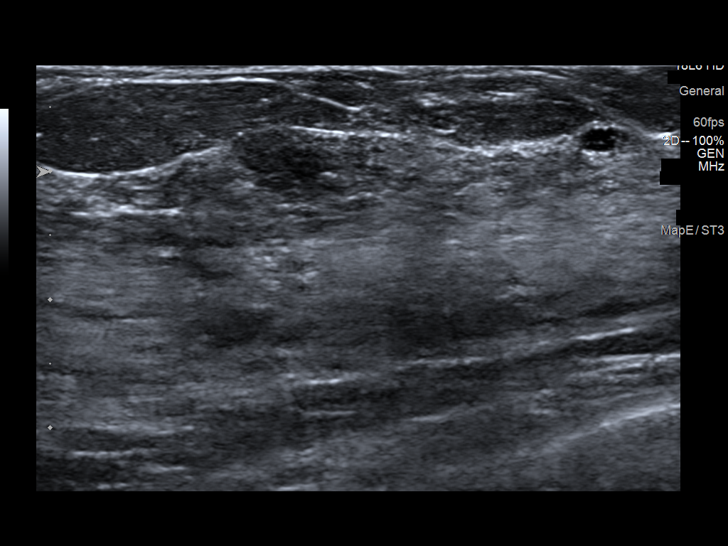
[im 2/13]
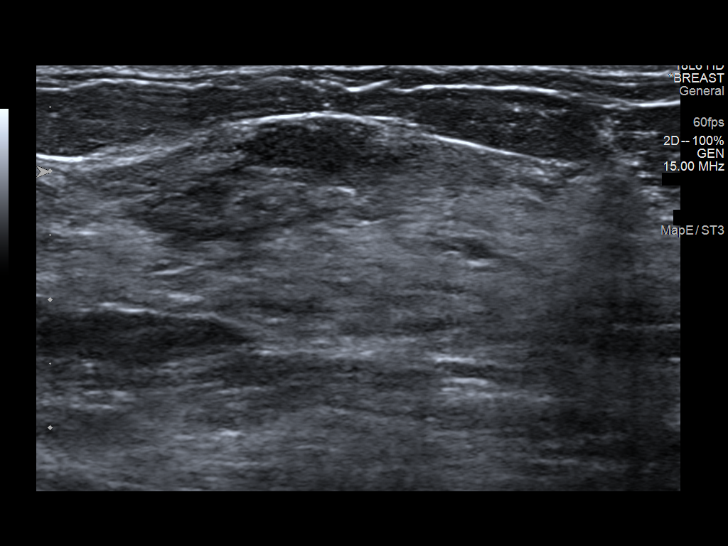
[im 3/13]
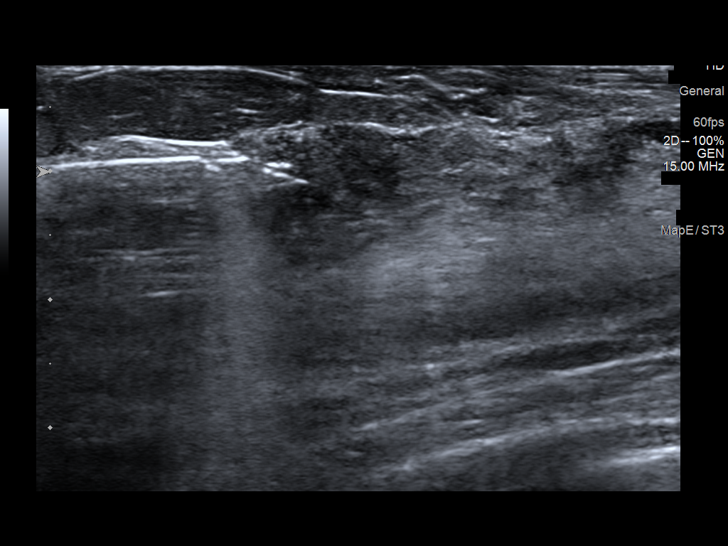
[im 4/13]
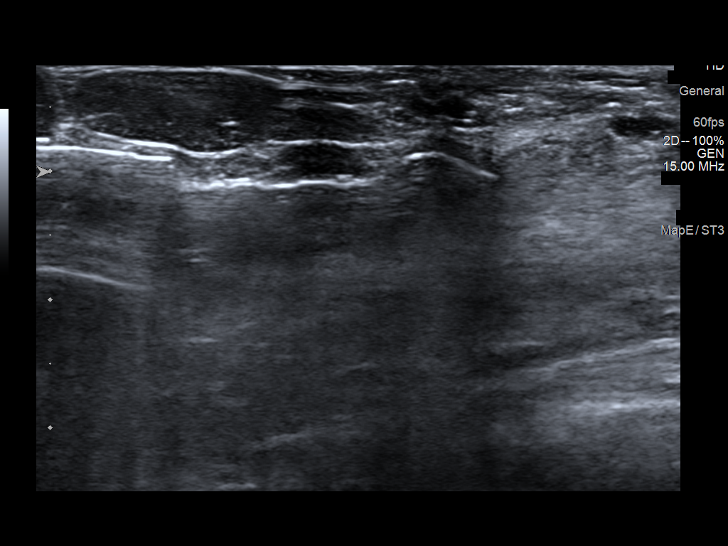
[im 5/13]
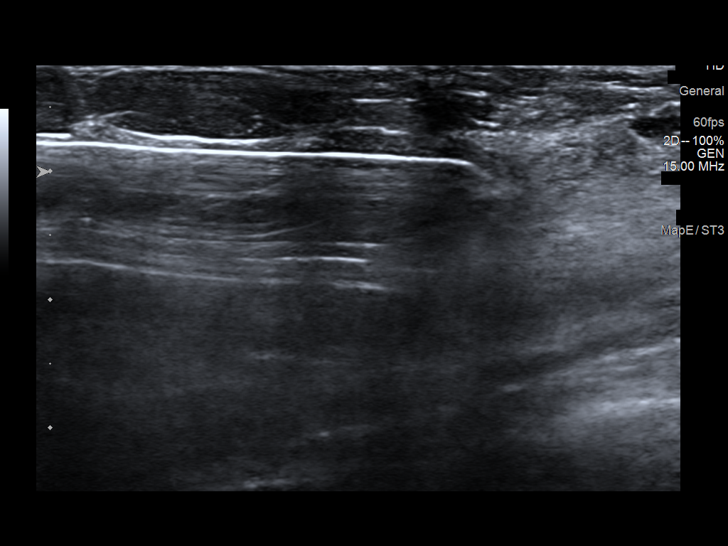
[im 6/13]
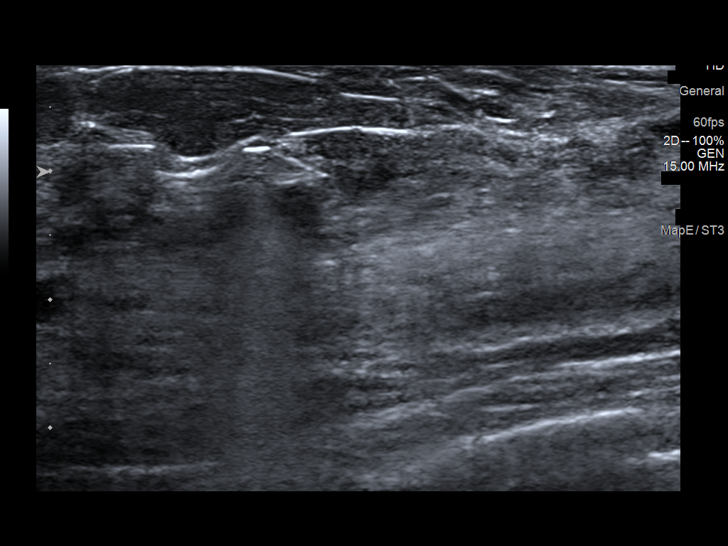
[im 8/13]
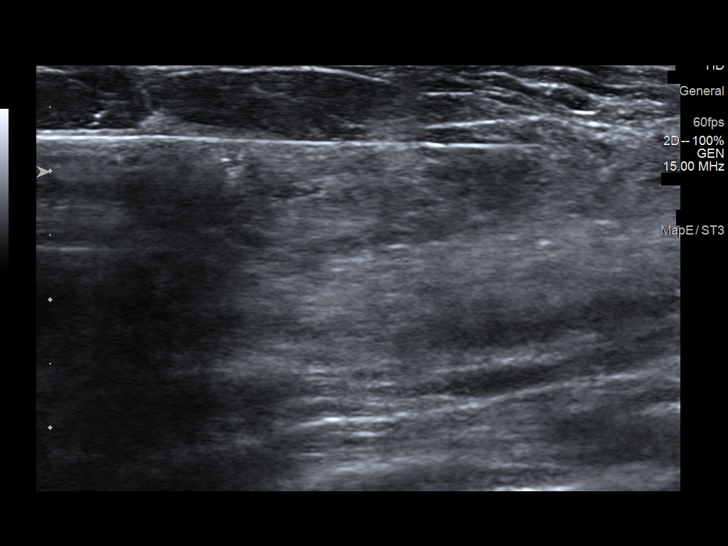
[im 9/13]
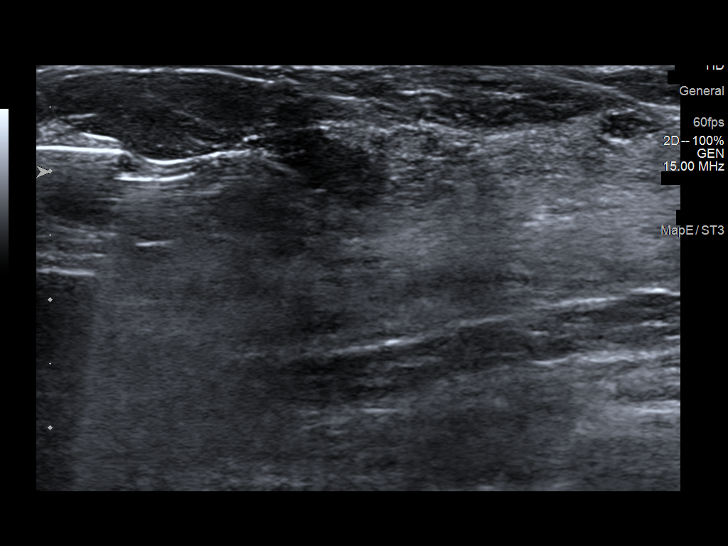
[im 10/13]
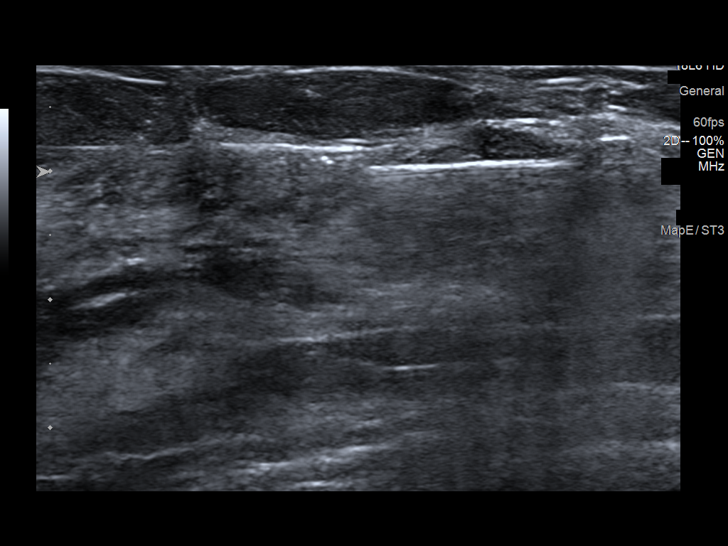
[im 11/13]
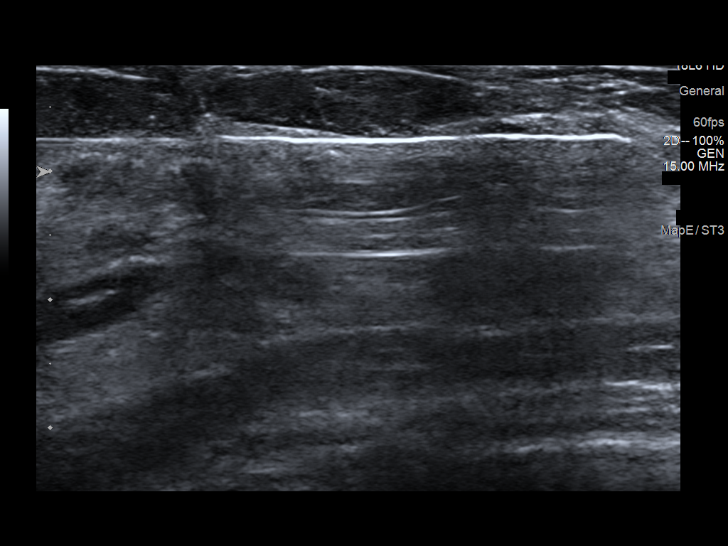
[im 12/13]
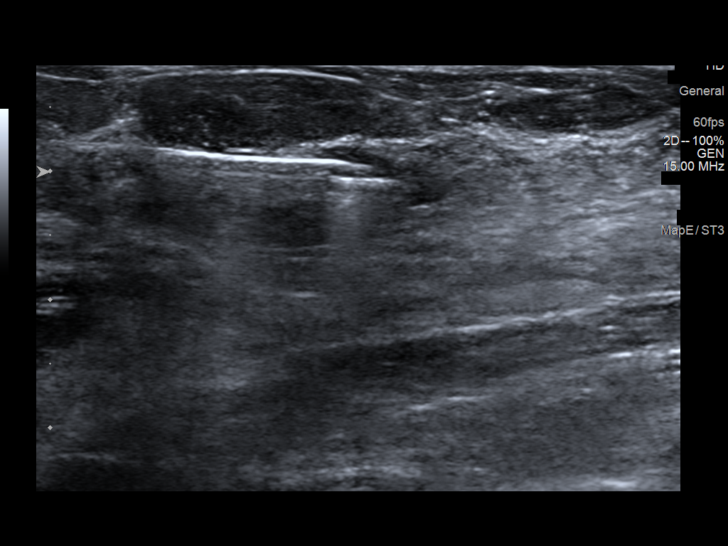
[im 13/13]
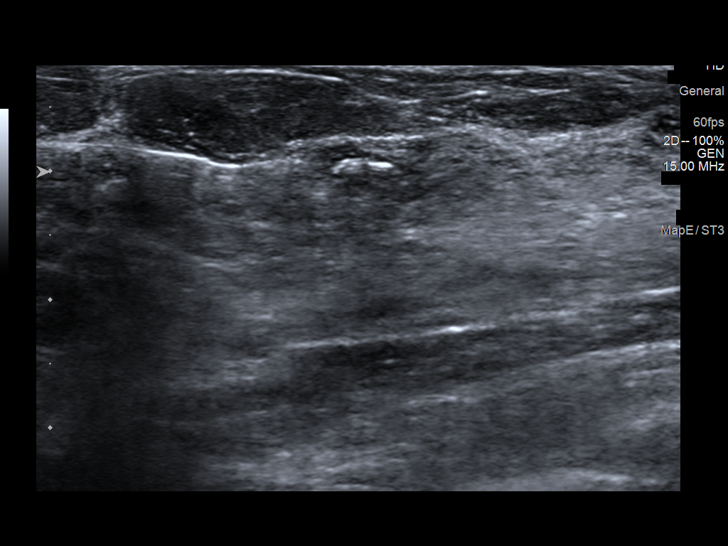

[12 of 13 positions shown; findings below may reference images not displayed]



Lesion quadrant: UPPER-OUTER LEFT breast

Using sterile technique and 1% Lidocaine as local anesthetic, under
direct ultrasound visualization, a 12 gauge Etoil device was
used to perform biopsy of the 1 cm mass at the 1 o'clock position of
the LEFT breast 5 cm from the nipple using a MEDIAL approach. At the
conclusion of the procedure a RIBBON shaped tissue marker clip was
deployed into the biopsy cavity. Follow up 2 view mammogram was
performed and dictated separately.
IMPRESSION: Ultrasound guided biopsy of 1 cm UPPER-OUTER LEFT breast mass. No
apparent complications.

ADDENDUM:
Pathology revealed BENIGN FIBROADENOMATOID NODULE of the LEFT
breast, 1 o'clock upper outer, (ribbon clip). This was found to be
concordant by Dr. Hidemaru Susantha.

Pathology results were discussed with the patient by telephone. The
patient reported doing well after the biopsy with tenderness at the
site. Post biopsy instructions and care were reviewed and questions
were answered. The patient was encouraged to call The [REDACTED]

The patient was instructed to return for annual screening
mammography in January 2023.

Pathology results reported by Klpigbb Moolman, RN on 01/19/2022.



Lesion quadrant: UPPER-OUTER LEFT breast

Using sterile technique and 1% Lidocaine as local anesthetic, under
direct ultrasound visualization, a 12 gauge Etoil device was
used to perform biopsy of the 1 cm mass at the 1 o'clock position of
the LEFT breast 5 cm from the nipple using a MEDIAL approach. At the
conclusion of the procedure a RIBBON shaped tissue marker clip was
deployed into the biopsy cavity. Follow up 2 view mammogram was
performed and dictated separately.
IMPRESSION: Ultrasound guided biopsy of 1 cm UPPER-OUTER LEFT breast mass. No
apparent complications.

## 2023-06-26 ENCOUNTER — Other Ambulatory Visit (HOSPITAL_COMMUNITY): Payer: Self-pay

## 2023-07-28 ENCOUNTER — Other Ambulatory Visit (HOSPITAL_COMMUNITY): Payer: Self-pay

## 2023-08-22 ENCOUNTER — Other Ambulatory Visit: Payer: Self-pay

## 2023-08-22 ENCOUNTER — Other Ambulatory Visit (HOSPITAL_COMMUNITY): Payer: Self-pay

## 2023-08-23 ENCOUNTER — Other Ambulatory Visit (HOSPITAL_COMMUNITY): Payer: Self-pay

## 2023-08-23 DIAGNOSIS — Z3202 Encounter for pregnancy test, result negative: Secondary | ICD-10-CM | POA: Diagnosis not present

## 2023-08-23 DIAGNOSIS — Z01419 Encounter for gynecological examination (general) (routine) without abnormal findings: Secondary | ICD-10-CM | POA: Diagnosis not present

## 2023-08-23 MED ORDER — PHEXXI 1.8-1-0.4 % VA GEL
VAGINAL | 11 refills | Status: AC
  Filled 2023-08-23: qty 60, 12d supply, fill #0
  Filled 2023-11-10: qty 60, 12d supply, fill #1

## 2023-09-08 DIAGNOSIS — R000 Tachycardia, unspecified: Secondary | ICD-10-CM | POA: Diagnosis not present

## 2023-10-26 ENCOUNTER — Other Ambulatory Visit (HOSPITAL_COMMUNITY): Payer: Self-pay

## 2023-10-26 DIAGNOSIS — Z1159 Encounter for screening for other viral diseases: Secondary | ICD-10-CM | POA: Diagnosis not present

## 2023-10-26 DIAGNOSIS — Z131 Encounter for screening for diabetes mellitus: Secondary | ICD-10-CM | POA: Diagnosis not present

## 2023-10-26 DIAGNOSIS — D539 Nutritional anemia, unspecified: Secondary | ICD-10-CM | POA: Diagnosis not present

## 2023-10-26 DIAGNOSIS — Z0000 Encounter for general adult medical examination without abnormal findings: Secondary | ICD-10-CM | POA: Diagnosis not present

## 2023-10-26 DIAGNOSIS — Z13220 Encounter for screening for lipoid disorders: Secondary | ICD-10-CM | POA: Diagnosis not present

## 2023-10-26 DIAGNOSIS — E559 Vitamin D deficiency, unspecified: Secondary | ICD-10-CM | POA: Diagnosis not present

## 2023-10-26 MED ORDER — NIFEDIPINE ER OSMOTIC RELEASE 30 MG PO TB24
30.0000 mg | ORAL_TABLET | Freq: Every day | ORAL | 5 refills | Status: AC
  Filled 2023-10-26 – 2024-02-11 (×2): qty 30, 30d supply, fill #0
  Filled 2024-04-27: qty 30, 30d supply, fill #1
  Filled 2024-05-27: qty 30, 30d supply, fill #2
  Filled 2024-06-25: qty 30, 30d supply, fill #3
  Filled 2024-07-26: qty 30, 30d supply, fill #4
  Filled 2024-08-23: qty 30, 30d supply, fill #5

## 2023-10-31 ENCOUNTER — Other Ambulatory Visit: Payer: Self-pay | Admitting: Physician Assistant

## 2023-10-31 DIAGNOSIS — Z1231 Encounter for screening mammogram for malignant neoplasm of breast: Secondary | ICD-10-CM

## 2023-11-09 ENCOUNTER — Ambulatory Visit
Admission: RE | Admit: 2023-11-09 | Discharge: 2023-11-09 | Disposition: A | Discharge status: Home / Self Care | Payer: Self-pay | Source: Ambulatory Visit | Attending: Physician Assistant | Admitting: Physician Assistant

## 2023-11-09 ENCOUNTER — Encounter: Payer: Self-pay | Admitting: Radiology

## 2023-11-09 DIAGNOSIS — Z1231 Encounter for screening mammogram for malignant neoplasm of breast: Secondary | ICD-10-CM | POA: Diagnosis not present

## 2023-11-10 ENCOUNTER — Other Ambulatory Visit (HOSPITAL_COMMUNITY): Payer: Self-pay

## 2023-11-10 ENCOUNTER — Other Ambulatory Visit: Payer: Self-pay

## 2023-11-15 ENCOUNTER — Other Ambulatory Visit: Payer: Self-pay | Admitting: Physician Assistant

## 2023-11-15 DIAGNOSIS — R928 Other abnormal and inconclusive findings on diagnostic imaging of breast: Secondary | ICD-10-CM

## 2023-11-23 ENCOUNTER — Ambulatory Visit
Admission: RE | Admit: 2023-11-23 | Discharge: 2023-11-23 | Disposition: A | Discharge status: Home / Self Care | Source: Ambulatory Visit | Attending: Physician Assistant | Admitting: Physician Assistant

## 2023-11-23 ENCOUNTER — Other Ambulatory Visit: Payer: Self-pay | Admitting: Physician Assistant

## 2023-11-23 DIAGNOSIS — R928 Other abnormal and inconclusive findings on diagnostic imaging of breast: Secondary | ICD-10-CM

## 2023-11-23 DIAGNOSIS — N6489 Other specified disorders of breast: Secondary | ICD-10-CM

## 2024-02-11 ENCOUNTER — Other Ambulatory Visit: Payer: Self-pay | Admitting: Nurse Practitioner

## 2024-02-11 ENCOUNTER — Telehealth: Admitting: Nurse Practitioner

## 2024-02-11 DIAGNOSIS — B370 Candidal stomatitis: Secondary | ICD-10-CM

## 2024-02-11 MED ORDER — NYSTATIN 100000 UNIT/ML MT SUSP
5.0000 mL | Freq: Four times a day (QID) | OROMUCOSAL | 0 refills | Status: DC
  Filled 2024-02-11: qty 473, 24d supply, fill #0

## 2024-02-11 MED ORDER — NYSTATIN 100000 UNIT/ML MT SUSP: 5.0000 mL | Freq: Four times a day (QID) | OROMUCOSAL | 0 refills | Status: AC

## 2024-02-11 NOTE — Progress Notes (Signed)
 E Visit for Rash  We are sorry that you are not feeling well. Here is how we plan to help!   Based upon your presentation it appears you have a fungal infection.  I have prescribed:  Nystatin oral solution  GET HELP RIGHT AWAY IF:  Symptoms don't go away after treatment. Severe itching that persists. If you rash spreads or swells. If you rash begins to smell. If it blisters and opens or develops a yellow-brown crust. You develop a fever. You have a sore throat. You become short of breath.  MAKE SURE YOU:  Understand these instructions. Will watch your condition. Will get help right away if you are not doing well or get worse.  Thank you for choosing an e-visit.  Your e-visit answers were reviewed by a board certified advanced clinical practitioner to complete your personal care plan. Depending upon the condition, your plan could have included both over the counter or prescription medications.  Please review your pharmacy choice. Make sure the pharmacy is open so you can pick up prescription now. If there is a problem, you may contact your provider through [employer] and have the prescription routed to another pharmacy.  Your safety is important to us . If you have drug allergies check your prescription carefully.   For the next 24 hours you can use MyChart to ask questions about today's visit, request a non-urgent call back, or ask for a work or school excuse. You will get an email in the next two days asking about your experience. I hope that your e-visit has been valuable and will speed your recovery.

## 2024-02-11 NOTE — Progress Notes (Signed)
 I have spent 5 minutes in review of e-visit questionnaire, review and updating patient chart, medical decision making and response to patient.   Claiborne Rigg, NP

## 2024-02-12 ENCOUNTER — Other Ambulatory Visit (HOSPITAL_COMMUNITY): Payer: Self-pay

## 2024-02-12 ENCOUNTER — Other Ambulatory Visit: Payer: Self-pay

## 2024-05-13 ENCOUNTER — Other Ambulatory Visit (HOSPITAL_COMMUNITY): Payer: Self-pay

## 2024-05-13 MED ORDER — IBUPROFEN 800 MG PO TABS
800.0000 mg | ORAL_TABLET | Freq: Three times a day (TID) | ORAL | 3 refills | Status: AC | PRN
  Filled 2024-05-13: qty 90, 30d supply, fill #0
  Filled 2024-08-08: qty 90, 30d supply, fill #1

## 2024-05-13 MED ORDER — NIFEDIPINE ER OSMOTIC RELEASE 30 MG PO TB24
30.0000 mg | ORAL_TABLET | Freq: Every day | ORAL | 5 refills | Status: AC
  Filled 2024-05-13: qty 30, 30d supply, fill #0
  Filled 2024-09-24: qty 90, 90d supply, fill #0

## 2024-05-13 MED ORDER — ONDANSETRON 8 MG PO TBDP
8.0000 mg | ORAL_TABLET | Freq: Four times a day (QID) | ORAL | 0 refills | Status: AC | PRN
  Filled 2024-05-13: qty 30, 8d supply, fill #0

## 2024-05-28 ENCOUNTER — Ambulatory Visit: Admission: RE | Admit: 2024-05-28 | Source: Ambulatory Visit

## 2024-05-28 ENCOUNTER — Ambulatory Visit
Admission: RE | Admit: 2024-05-28 | Discharge: 2024-05-28 | Disposition: A | Discharge status: Home / Self Care | Source: Ambulatory Visit | Attending: Physician Assistant | Admitting: Physician Assistant

## 2024-05-28 DIAGNOSIS — R928 Other abnormal and inconclusive findings on diagnostic imaging of breast: Secondary | ICD-10-CM

## 2024-05-28 DIAGNOSIS — N6489 Other specified disorders of breast: Secondary | ICD-10-CM

## 2024-06-04 ENCOUNTER — Other Ambulatory Visit: Payer: Self-pay | Admitting: Physician Assistant

## 2024-06-04 DIAGNOSIS — N6489 Other specified disorders of breast: Secondary | ICD-10-CM

## 2024-07-27 ENCOUNTER — Other Ambulatory Visit (HOSPITAL_COMMUNITY): Payer: Self-pay

## 2024-09-24 ENCOUNTER — Other Ambulatory Visit (HOSPITAL_COMMUNITY): Payer: Self-pay

## 2024-09-24 ENCOUNTER — Other Ambulatory Visit: Payer: Self-pay

## 2024-12-02 ENCOUNTER — Encounter
